# Patient Record
Sex: Female | Born: 2000 | Race: Black or African American | Hispanic: No | Marital: Single | Smoking: Never smoker
Health system: Southern US, Community
[De-identification: ages and names within clinical notes are randomized; demographics above are authoritative.]

## PROBLEM LIST (undated history)

## (undated) ENCOUNTER — Inpatient Hospital Stay (HOSPITAL_COMMUNITY): Payer: Self-pay

## (undated) DIAGNOSIS — O139 Gestational [pregnancy-induced] hypertension without significant proteinuria, unspecified trimester: Secondary | ICD-10-CM

## (undated) HISTORY — DX: Gestational (pregnancy-induced) hypertension without significant proteinuria, unspecified trimester: O13.9

---
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
## 2020-12-02 NOTE — L&D Delivery Note (Signed)
Delivery Note Called to bedside and patient complete and pushing. At 7:07 AM a viable female was delivered via Vaginal, Spontaneous (Presentation: Middle Occiput Anterior).  APGAR: 8, 9; weight pending. No nuchal cord noted at delivery   Placenta status: Spontaneous, Intact.  Cord: 3 vessels with the following complications: none. Placenta to pathology with marginal cord insert.  Anesthesia: Epidural Episiotomy: None Lacerations: 1st degree Suture Repair: 3.0 vicryl Est. Blood Loss (mL): 200  Mom to postpartum.  Baby to Couplet care / Skin to Skin.  Alric Seton 06/15/2021, 7:58 AM

## 2021-01-16 ENCOUNTER — Other Ambulatory Visit: Payer: Self-pay

## 2021-01-16 ENCOUNTER — Ambulatory Visit (INDEPENDENT_AMBULATORY_CARE_PROVIDER_SITE_OTHER): Payer: BC Managed Care – PPO | Admitting: Advanced Practice Midwife

## 2021-01-16 ENCOUNTER — Encounter: Payer: Self-pay | Admitting: Advanced Practice Midwife

## 2021-01-16 ENCOUNTER — Other Ambulatory Visit (HOSPITAL_COMMUNITY)
Admission: RE | Admit: 2021-01-16 | Discharge: 2021-01-16 | Disposition: A | Discharge status: Home / Self Care | Payer: BC Managed Care – PPO | Source: Ambulatory Visit | Attending: Advanced Practice Midwife | Admitting: Advanced Practice Midwife

## 2021-01-16 VITALS — BP 116/65 | HR 82 | Wt 173.0 lb

## 2021-01-16 DIAGNOSIS — O99810 Abnormal glucose complicating pregnancy: Secondary | ICD-10-CM | POA: Insufficient documentation

## 2021-01-16 DIAGNOSIS — Z3A11 11 weeks gestation of pregnancy: Secondary | ICD-10-CM | POA: Insufficient documentation

## 2021-01-16 DIAGNOSIS — Z3A16 16 weeks gestation of pregnancy: Secondary | ICD-10-CM | POA: Diagnosis present

## 2021-01-16 DIAGNOSIS — Z3400 Encounter for supervision of normal first pregnancy, unspecified trimester: Secondary | ICD-10-CM | POA: Diagnosis present

## 2021-01-16 DIAGNOSIS — B354 Tinea corporis: Secondary | ICD-10-CM | POA: Diagnosis not present

## 2021-01-16 MED ORDER — CLOTRIMAZOLE-BETAMETHASONE 1-0.05 % EX CREA: 1.0000 "application " | TOPICAL_CREAM | Freq: Two times a day (BID) | CUTANEOUS | 0 refills | Status: DC

## 2021-01-16 MED ORDER — PRENATAL VITAMINS 28-0.8 MG PO TABS: ORAL_TABLET | ORAL | 11 refills | Status: DC

## 2021-01-16 NOTE — Progress Notes (Signed)
DATING AND VIABILITY SONOGRAM   Laurie Farmer is a 20 y.o. year old G1P0 with LMP Patient's last menstrual period was 10/29/2020. which would correlate to  [redacted]w[redacted]d weeks gestation.  She has irregular menstrual cycles.   She is here today for a confirmatory initial sonogram.    GESTATION: SINGLETON     FETAL ACTIVITY:          Heart rate         159 bpm          The fetus is active.    GESTATIONAL AGE AND  BIOMETRICS:  Gestational criteria: Estimated Date of Delivery: 08/05/21 by LMP now at [redacted]w[redacted]d  Previous Scans:0  HC 12.03 cm 16-0 weeks  BPD 3.27 cm 16-1 weeks  FL 1.55 cm 15-1 weeks                                                                           AVERAGE EGA(BY THIS SCAN): 16-0  weeks  WORKING EDD( midtrimester ultrasound ):       TECHNICIAN COMMENTS:  Patient informed that the ultrasound is considered a limited obstetric ultrasound and is not intended to be a complete ultrasound exam. Patient also informed that the ultrasound is not being completed with the intent of assessing for fetal or placental anomalies or any pelvic abnormalities. Explained that the purpose of today's ultrasound is to assess for fetal heart rate. Patient acknowledges the purpose of the exam and the limitations of the study.     Armandina Stammer 01/16/2021 10:05 AM  Patient was assessed and managed by nursing staff during this encounter. I have reviewed the chart and agree with the documentation and plan. I have also made any necessary editorial changes.  Wynelle Bourgeois, CNM 01/16/2021 12:19 PM

## 2021-01-16 NOTE — Addendum Note (Signed)
Addended by: Mikey Bussing on: 01/16/2021 04:12 PM   Modules accepted: Orders

## 2021-01-16 NOTE — Progress Notes (Signed)
INITIAL OBSTETRICAL VISIT Patient name: Laurie Farmer MRN [id number]  Date of birth: 02-11-2001 Chief Complaint:   Initial Prenatal Visit  History of Present Illness:   Laurie Farmer is a 20 y.o. G1P0 African American female at [redacted]w[redacted]d by [place] at 16 weeks (11 weeks by LMP)  with an Estimated Date of Delivery: 08/05/21 being seen today for her initial obstetrical visit.   Her obstetrical history is significant for primigravida, possible Type 2 DM.   Her mother states Pediatrician told her when she was 13 that her blood sugar was elevated.  Put her on a diet but did not do formal testing.  Never gave her a meter to use  Today she reports no complaints.    Patient's last menstrual period was 10/29/2020. Last pap never.  Review of Systems:   Pertinent items are noted in HPI Denies cramping/contractions, leakage of fluid, vaginal bleeding, abnormal vaginal discharge w/ itching/odor/irritation, headaches, visual changes, shortness of breath, chest pain, abdominal pain, severe nausea/vomiting, or problems with urination or bowel movements unless otherwise stated above.  Pertinent History Reviewed:  Reviewed past medical,surgical, social, obstetrical and family history.  Reviewed problem list, medications and allergies. OB History  Gravida Para Term Preterm AB Living  1            SAB IAB Ectopic Multiple Live Births               # Outcome Date GA Lbr Len/2nd Weight Sex Delivery Anes PTL Lv  1 Current            Physical Assessment:   Vitals:   01/16/21 0924  BP: 116/65  Pulse: 82  Weight: 173 lb (78.5 kg)  There is no height or weight on file to calculate BMI.       Physical Examination:  General appearance - well appearing, and in no distress  Mental status - alert, oriented to person, place, and time  Psych:  She has a normal mood and affect  Skin - warm and dry, normal color, no suspicious lesions noted  Chest - effort normal, all lung fields clear to auscultation  bilaterally  Heart - normal rate and regular rhythm  Abdomen - soft, nontender  Extremities:  No swelling or varicosities noted  Pelvic - VULVA: normal appearing vulva with no masses, tenderness or lesions  VAGINA: normal appearing vagina with normal color and discharge, no lesions  CERVIX: normal appearing cervix without discharge or lesions, no CMT  Thin prep pap is not done  Chaperone: Chiquita      Assessment & Plan:  1) Low-Risk Pregnancy G1P0 at [redacted]w[redacted]d with an Estimated Date of Delivery: 07/03/21      May be high risk if diabetic  2) Initial OB visit  3) Possible Type 2 DM.  Limited info from Pediatrician.  Never got formal testing  Meds:  Meds ordered this encounter  Medications  . clotrimazole-betamethasone (LOTRISONE) cream    Sig: Apply 1 application topically 2 (two) times daily.    Dispense:  30 g    Refill:  0    Order Specific Question:   Supervising Provider    Answer:   Jaynie Collins A [[id number]]    Initial labs obtained Continue prenatal vitamins Reviewed n/v relief measures and warning s/s to report Reviewed recommended weight gain based on pre-gravid BMI Encouraged well-balanced diet Genetic & carrier screening discussed: requests Panorama, requests Panorama Ultrasound discussed; fetal survey: requested CCNC completed> form faxed if has or is planning to apply  for medicaid The nature of  - [facility] [employer] with multiple MDs and other Advanced Practice Providers was explained to patient; also emphasized that fellows, residents, and students are part of our team.   Indications for ASA therapy (per uptodate) One of the following: H/O preeclampsia, especially early onset/adverse outcome No Multifetal gestation No CHTN No T1DM or T2DM No Chronic kidney disease No Autoimmune disease (antiphospholipid syndrome, systemic lupus erythematosus) No  OR Two or more of the following: Nulliparity Yes Obesity (BMI>30 kg/m2) No Family h/o  preeclampsia in mother or sister No Age ?35 years No Sociodemographic characteristics (African American race, low socioeconomic level) Yes Personal risk factors (eg, previous pregnancy w/ LBW or SGA, previous adverse pregnancy outcome [eg, stillbirth], interval >10 years between pregnancies) No  Indications for early A1C (per uptodate) BMI >=25 (>=23 in Asian women) AND one of the following GDM in a previous pregnancy No Previous A1C?5.7, impaired glucose tolerance, or impaired fasting glucose on previous testing No First-degree relative with diabetes No High-risk race/ethnicity (eg, African American, Latino, Native American, Asian American, Pacific Islander) Yes History of cardiovascular disease No HTN or on therapy for hypertension No HDL cholesterol level <35 mg/dL (0.10 mmol/L) and/or a triglyceride level >250 mg/dL (9.32 mmol/L) No PCOS No Physical inactivity No Other clinical condition associated with insulin resistance (eg, severe obesity, acanthosis nigricans) Yes  Told by pediatrician sugar was high, never tested Previous birth of an infant weighing ?4000 g No Previous stillbirth of unknown cause No >= 40yo No  Follow-up: Return in about 4 weeks (around 02/13/2021) for [employer].   Orders Placed This Encounter  Procedures  . Culture, OB Urine  . [place] MFM OB COMP + 14 WK  . Hemoglobpathy+Fer w/A Thal Rfx  . CBC/D/Plt+RPR+Rh+ABO+Rub Ab...  . AFP, Serum, Open Spina Bifida  . Hemoglobin A1c  . Genetic Screening  . POC Urinalysis Dipstick OB   Start baby ASA daily   Wynelle Bourgeois CNM, [facility] 01/16/2021 12:19 PM

## 2021-01-16 NOTE — Patient Instructions (Signed)
Oral Glucose Tolerance Test During Pregnancy Why am I having this test? The oral glucose tolerance test (OGTT) is done to check how your body processes blood sugar (glucose). This is one of several tests used to diagnose diabetes that develops during pregnancy (gestational diabetes mellitus). Gestational diabetes is a short-term form of diabetes that some women develop while they are pregnant. It usually occurs during the second trimester of pregnancy and goes away after delivery. Testing, or screening, for gestational diabetes usually occurs at weeks 24-28 of pregnancy. You may have the OGTT test after having a 1-hour glucose screening test if the results from that test indicate that you may have gestational diabetes. This test may also be needed if:  You have a history of gestational diabetes.  There is a history of giving birth to very large babies or of losing pregnancies (having stillbirths).  You have signs and symptoms of diabetes, such as: ? Changes in your eyesight. ? Tingling or numbness in your hands or feet. ? Changes in hunger, thirst, and urination, and these are not explained by your pregnancy. What is being tested? This test measures the amount of glucose in your blood at different times during a period of 3 hours. This shows how well your body can process glucose. What kind of sample is taken? Blood samples are required for this test. They are usually collected by inserting a needle into a blood vessel.   How do I prepare for this test?  For 3 days before your test, eat normally. Have plenty of carbohydrate-rich foods.  Follow instructions from your health care provider about: ? Eating or drinking restrictions on the day of the test. You may be asked not to eat or drink anything other than water (to fast) starting 8-10 hours before the test. ? Changing or stopping your regular medicines. Some medicines may interfere with this test. Tell a health care provider about:  All  medicines you are taking, including vitamins, herbs, eye drops, creams, and over-the-counter medicines.  Any blood disorders you have.  Any surgeries you have had.  Any medical conditions you have. What happens during the test? First, your blood glucose will be measured. This is referred to as your fasting blood glucose because you fasted before the test. Then, you will drink a glucose solution that contains a certain amount of glucose. Your blood glucose will be measured again 1, 2, and 3 hours after you drink the solution. This test takes about 3 hours to complete. You will need to stay at the testing location during this time. During the testing period:  Do not eat or drink anything other than the glucose solution.  Do not exercise.  Do not use any products that contain nicotine or tobacco, such as cigarettes, e-cigarettes, and chewing tobacco. These can affect your test results. If you need help quitting, ask your health care provider. The testing procedure may vary among health care providers and hospitals. How are the results reported? Your results will be reported as milligrams of glucose per deciliter of blood (mg/dL) or millimoles per liter (mmol/L). There is more than one source for screening and diagnosis reference values used to diagnose gestational diabetes. Your health care provider will compare your results to normal values that were established after testing a large group of people (reference values). Reference values may vary among labs and hospitals. For this test (Carpenter-Coustan), reference values are:  Fasting: 95 mg/dL (5.3 mmol/L).  1 hour: 180 mg/dL (10.0 mmol/L).  2 hour:   155 mg/dL (8.6 mmol/L).  3 hour: 140 mg/dL (7.8 mmol/L). What do the results mean? Results below the reference values are considered normal. If two or more of your blood glucose levels are at or above the reference values, you may be diagnosed with gestational diabetes. If only one level is  high, your health care provider may suggest repeat testing or other tests to confirm a diagnosis. Talk with your health care provider about what your results mean. Questions to ask your health care provider Ask your health care provider, or the department that is doing the test:  When will my results be ready?  How will I get my results?  What are my treatment options?  What other tests do I need?  What are my next steps? Summary  The oral glucose tolerance test (OGTT) is one of several tests used to diagnose diabetes that develops during pregnancy (gestational diabetes mellitus). Gestational diabetes is a short-term form of diabetes that some women develop while they are pregnant.  You may have the OGTT test after having a 1-hour glucose screening test if the results from that test show that you may have gestational diabetes. You may also have this test if you have any symptoms or risk factors for this type of diabetes.  Talk with your health care provider about what your results mean. This information is not intended to replace advice given to you by your health care provider. Make sure you discuss any questions you have with your health care provider. Document Revised: 04/27/2020 Document Reviewed: 04/27/2020 Elsevier Patient Education  2021 Elsevier Inc. Second Trimester of Pregnancy  The second trimester of pregnancy is from week 13 through week 27. This is also called months 4 through 6 of pregnancy. This is often the time when you feel your best. During the second trimester:  Morning sickness is less or has stopped.  You may have more energy.  You may feel hungry more often. At this time, your unborn baby (fetus) is growing very fast. At the end of the sixth month, the unborn baby may be up to 12 inches long and weigh about 1 pounds. You will likely start to feel the baby move between 16 and 20 weeks of pregnancy. Body changes during your second trimester Your body  continues to go through many changes during this time. The changes vary and generally return to normal after the baby is born. Physical changes  You will gain more weight.  You may start to get stretch marks on your hips, belly (abdomen), and breasts.  Your breasts will grow and may hurt.  Dark spots or blotches may develop on your face.  A dark line from your belly button to the pubic area (linea nigra) may appear.  You may have changes in your hair. Health changes  You may have headaches.  You may have heartburn.  You may have trouble pooping (constipation).  You may have hemorrhoids or swollen, bulging veins (varicose veins).  Your gums may bleed.  You may pee (urinate) more often.  You may have back pain. Follow these instructions at home: Medicines  Take over-the-counter and prescription medicines only as told by your doctor. Some medicines are not safe during pregnancy.  Take a prenatal vitamin that contains at least 600 micrograms (mcg) of folic acid. Eating and drinking  Eat healthy meals that include: ? Fresh fruits and vegetables. ? Whole grains. ? Good sources of protein, such as meat, eggs, or tofu. ? Low-fat dairy products.  Avoid raw meat and unpasteurized juice, milk, and cheese.  You may need to take these actions to prevent or treat trouble pooping: ? Drink enough fluids to keep your pee (urine) pale yellow. ? Eat foods that are high in fiber. These include beans, whole grains, and fresh fruits and vegetables. ? Limit foods that are high in fat and sugar. These include fried or sweet foods. Activity  Exercise only as told by your doctor. Most people can do their usual exercise during pregnancy. Try to exercise for 30 minutes at least 5 days a week.  Stop exercising if you have pain or cramps in your belly or lower back.  Do not exercise if it is too hot or too humid, or if you are in a place of great height (high altitude).  Avoid heavy  lifting.  If you choose to, you may have sex unless your doctor tells you not to. Relieving pain and discomfort  Wear a good support bra if your breasts are sore.  Take warm water baths (sitz baths) to soothe pain or discomfort caused by hemorrhoids. Use hemorrhoid cream if your doctor approves.  Rest with your legs raised (elevated) if you have leg cramps or low back pain.  If you develop bulging veins in your legs: ? Wear support hose as told by your doctor. ? Raise your feet for 15 minutes, 3-4 times a day. ? Limit salt in your food. Safety  Wear your seat belt at all times when you are in a car.  Talk with your doctor if someone is hurting you or yelling at you a lot. Lifestyle  Do not use hot tubs, steam rooms, or saunas.  Do not douche. Do not use tampons or scented sanitary pads.  Avoid cat litter boxes and soil used by cats. These carry germs that can harm your baby and can cause a loss of your baby by miscarriage or stillbirth.  Do not use herbal medicines, illegal drugs, or medicines that are not approved by your doctor. Do not drink alcohol.  Do not smoke or use any products that contain nicotine or tobacco. If you need help quitting, ask your doctor. General instructions  Keep all follow-up visits. This is important.  Ask your doctor about local prenatal classes.  Ask your doctor about the right foods to eat or for help finding a counselor. Where to find more information  American Pregnancy Association: americanpregnancy.org  [employer]: www.acog.org  Office on [employer] [facility]: [employer].nz Contact a doctor if:  You have a headache that does not go away when you take medicine.  You have changes in how you see, or you see spots in front of your eyes.  You have mild cramps, pressure, or pain in your lower belly.  You continue to feel like you may vomit (nauseous), you vomit, or you have watery poop  (diarrhea).  You have bad-smelling fluid coming from your vagina.  You have pain when you pee or your pee smells bad.  You have very bad swelling of your face, hands, ankles, feet, or legs.  You have a fever. Get help right away if:  You are leaking fluid from your vagina.  You have spotting or bleeding from your vagina.  You have very bad belly cramping or pain.  You have trouble breathing.  You have chest pain.  You faint.  You have not felt your baby move for the time period told by your doctor.  You have new  You have new or increased pain, swelling, or redness in an arm or leg. Summary  The second trimester of pregnancy is from week 13 through week 27 (months 4 through 6).  Eat healthy meals.  Exercise as told by your doctor. Most people can do their usual exercise during pregnancy.  Do not use herbal medicines, illegal drugs, or medicines that are not approved by your doctor. Do not drink alcohol.  Call your doctor if you get sick or if you notice anything unusual about your pregnancy. This information is not intended to replace advice given to you by your health care provider. Make sure you discuss any questions you have with your health care provider. Document Revised: 04/26/2020 Document Reviewed: 03/02/2020 Elsevier Patient Education  2021 Elsevier Inc.  

## 2021-01-17 ENCOUNTER — Encounter: Payer: Self-pay | Admitting: Advanced Practice Midwife

## 2021-01-17 DIAGNOSIS — O98212 Gonorrhea complicating pregnancy, second trimester: Secondary | ICD-10-CM | POA: Insufficient documentation

## 2021-01-17 LAB — GC/CHLAMYDIA PROBE AMP (~~LOC~~) NOT AT ARMC
Chlamydia: NEGATIVE
Comment: NEGATIVE
Comment: NORMAL
Neisseria Gonorrhea: POSITIVE — AB

## 2021-01-18 LAB — AFP, SERUM, OPEN SPINA BIFIDA
AFP MoM: 1.09
AFP Value: 36.8 ng/mL
Gest. Age on Collection Date: 16 weeks
Maternal Age At EDD: 20.5 yr
OSBR Risk 1 IN: 10000
Test Results:: NEGATIVE
Weight: 173 [lb_av]

## 2021-01-18 LAB — HEMOGLOBIN A1C
Est. average glucose Bld gHb Est-mCnc: 108 mg/dL
Hgb A1c MFr Bld: 5.4 % (ref 4.8–5.6)

## 2021-01-19 LAB — URINE CULTURE, OB REFLEX

## 2021-01-19 LAB — CULTURE, OB URINE

## 2021-01-22 ENCOUNTER — Telehealth: Payer: Self-pay

## 2021-01-22 NOTE — Telephone Encounter (Signed)
Called pt to explain to her why she has been prescribed baby aspirin. Per Laurie Farmer, pt was prescribed aspirin because of her being a first time mom and her ethnicity puts her at a higher risk for developing high blood pressure later in pregnancy.Understanding was voiced. Dalaney Needle l Dimitria Ketchum, CMA

## 2021-01-22 NOTE — Telephone Encounter (Signed)
-----   Message from Aviva Signs, CNM sent at 01/16/2021  9:07 PM EST ----- Regarding: RE: Aspirin Her being a first time mom and ethnicity puts her at higher risk of developing high blood pressure later in pregnancy The aspirin can help prevent that  I told her that several times on the phone  Hilda Lias   ----- Message ----- From: Mikey Bussing, CMA Sent: 01/16/2021   3:32 PM EST To: Aviva Signs, CNM Subject: Aspirin                                        Hi Hilda Lias, this pt wants to know why she has to take aspirin?

## 2021-01-23 ENCOUNTER — Other Ambulatory Visit: Payer: Self-pay | Admitting: Advanced Practice Midwife

## 2021-01-23 ENCOUNTER — Telehealth: Payer: Self-pay

## 2021-01-23 MED ORDER — CEFADROXIL 500 MG PO CAPS: 500.0000 mg | ORAL_CAPSULE | Freq: Two times a day (BID) | ORAL | 0 refills | Status: AC

## 2021-01-23 NOTE — Telephone Encounter (Signed)
Patient called and made aware of Gonorrhea positive result. Patient made aware that she will need to come in for injection of rocephin. Patient also made aware to refrain from any intercourse until her partner is tested. Patient states understanding and schedule appointment for nurse visit tomorrow at 2pm. Armandina Stammer RN

## 2021-01-23 NOTE — Progress Notes (Signed)
Urine culture klebsiella  Rx sent for Duricef  Pt notified

## 2021-01-24 ENCOUNTER — Ambulatory Visit (INDEPENDENT_AMBULATORY_CARE_PROVIDER_SITE_OTHER): Payer: BC Managed Care – PPO

## 2021-01-24 ENCOUNTER — Other Ambulatory Visit: Payer: Self-pay

## 2021-01-24 VITALS — BP 118/68 | HR 84

## 2021-01-24 DIAGNOSIS — Z3A17 17 weeks gestation of pregnancy: Secondary | ICD-10-CM | POA: Diagnosis not present

## 2021-01-24 DIAGNOSIS — O98212 Gonorrhea complicating pregnancy, second trimester: Secondary | ICD-10-CM

## 2021-01-24 MED ORDER — CEFTRIAXONE SODIUM 500 MG IJ SOLR
500.0000 mg | Freq: Once | INTRAMUSCULAR | Status: AC
  Administered 2021-01-24: 500 mg via INTRAMUSCULAR

## 2021-01-24 NOTE — Progress Notes (Addendum)
Patient presents for rocephin injection due to positive Gonorrhea. Patient made aware to abstain from any intercourse until her partner had been tested and/or treated. Patient states understanding. Also made aware that we will retest her in a month for a test of cure. Armandina Stammer RN    Patient was assessed and managed by nursing staff during this encounter. I have reviewed the chart and agree with the documentation and plan. I have also made any necessary editorial changes.  Wynelle Bourgeois, CNM 01/24/2021 9:56 PM

## 2021-01-26 LAB — ALPHA-THALASSEMIA

## 2021-01-26 LAB — CBC/D/PLT+RPR+RH+ABO+RUB AB...
Antibody Screen: NEGATIVE
Basophils Absolute: 0 10*3/uL (ref 0.0–0.2)
Basos: 1 %
EOS (ABSOLUTE): 0.1 10*3/uL (ref 0.0–0.4)
Eos: 2 %
HCV Ab: <0.1 s/co ratio (ref 0.0–0.9)
HIV Screen 4th Generation wRfx: NONREACTIVE
Hematocrit: 36 % (ref 34.0–46.6)
Hemoglobin: 11.7 g/dL (ref 11.1–15.9)
Hepatitis B Surface Ag: NEGATIVE
Immature Grans (Abs): 0 10*3/uL (ref 0.0–0.1)
Immature Granulocytes: 0 %
Lymphocytes Absolute: 2.1 10*3/uL (ref 0.7–3.1)
Lymphs: 32 %
MCH: 25.2 pg — ABNORMAL LOW (ref 26.6–33.0)
MCHC: 32.5 g/dL (ref 31.5–35.7)
MCV: 78 fL — ABNORMAL LOW (ref 79–97)
Monocytes Absolute: 0.6 10*3/uL (ref 0.1–0.9)
Monocytes: 9 %
Neutrophils Absolute: 3.7 10*3/uL (ref 1.4–7.0)
Neutrophils: 56 %
Platelets: 262 10*3/uL (ref 150–450)
RBC: 4.64 x10E6/uL (ref 3.77–5.28)
RDW: 14.8 % (ref 11.7–15.4)
RPR Ser Ql: REACTIVE — AB
Rh Factor: POSITIVE
Rubella Antibodies, IGG: 2.38 index (ref >0.99)
WBC: 6.5 10*3/uL (ref 3.4–10.8)

## 2021-01-26 LAB — HEMOGLOBPATHY+FER W/A THAL RFX
Ferritin: 38 ng/mL (ref 15–150)
Hgb A2: 2.5 % (ref 1.8–3.2)
Hgb A: 97.5 % (ref 96.4–98.8)
Hgb F: 0 % (ref 0.0–2.0)
Hgb S: 0 %

## 2021-01-26 LAB — RPR, QUANT+TP ABS (REFLEX)
Rapid Plasma Reagin, Quant: 1:1 {titer} — ABNORMAL HIGH
T Pallidum Abs: NONREACTIVE

## 2021-01-26 LAB — HCV INTERPRETATION

## 2021-01-29 ENCOUNTER — Telehealth: Payer: Self-pay

## 2021-01-29 NOTE — Telephone Encounter (Signed)
[facility] called and wanted to verify that we received the alpha thalassemia results on patient (abnormal). Verified we had received and will route this to the attention of a provider. Armandina Stammer RN

## 2021-01-30 ENCOUNTER — Other Ambulatory Visit: Payer: Self-pay

## 2021-01-30 NOTE — Progress Notes (Signed)
error 

## 2021-02-06 ENCOUNTER — Other Ambulatory Visit: Payer: Self-pay

## 2021-02-06 ENCOUNTER — Telehealth: Payer: Self-pay

## 2021-02-06 ENCOUNTER — Ambulatory Visit: Payer: BC Managed Care – PPO | Attending: Advanced Practice Midwife

## 2021-02-06 DIAGNOSIS — Z3400 Encounter for supervision of normal first pregnancy, unspecified trimester: Secondary | ICD-10-CM | POA: Diagnosis present

## 2021-02-06 DIAGNOSIS — Z3A16 16 weeks gestation of pregnancy: Secondary | ICD-10-CM | POA: Diagnosis present

## 2021-02-06 MED ORDER — CEFADROXIL 500 MG PO CAPS: 500.0000 mg | ORAL_CAPSULE | Freq: Two times a day (BID) | ORAL | 0 refills | Status: DC

## 2021-02-06 NOTE — Telephone Encounter (Signed)
Patient called stating that she dropped the remaining antibiotics in the toilet. Patient states she had six doses left. Patient states that she picked it up later because of insurance issues. Made her aware that we will try and send in the remaining she had but insurance may give her problem about filling it again. Armandina Stammer RN

## 2021-02-07 ENCOUNTER — Other Ambulatory Visit: Payer: Self-pay | Admitting: *Deleted

## 2021-02-07 DIAGNOSIS — Z362 Encounter for other antenatal screening follow-up: Secondary | ICD-10-CM

## 2021-02-13 ENCOUNTER — Ambulatory Visit (INDEPENDENT_AMBULATORY_CARE_PROVIDER_SITE_OTHER): Payer: BC Managed Care – PPO | Admitting: Advanced Practice Midwife

## 2021-02-13 ENCOUNTER — Other Ambulatory Visit (HOSPITAL_COMMUNITY)
Admission: RE | Admit: 2021-02-13 | Discharge: 2021-02-13 | Disposition: A | Discharge status: Home / Self Care | Payer: BC Managed Care – PPO | Source: Ambulatory Visit | Attending: Advanced Practice Midwife | Admitting: Advanced Practice Midwife

## 2021-02-13 ENCOUNTER — Encounter: Payer: Self-pay | Admitting: Advanced Practice Midwife

## 2021-02-13 ENCOUNTER — Other Ambulatory Visit: Payer: Self-pay

## 2021-02-13 VITALS — BP 106/61 | HR 90 | Wt 176.0 lb

## 2021-02-13 DIAGNOSIS — D563 Thalassemia minor: Secondary | ICD-10-CM

## 2021-02-13 DIAGNOSIS — N3000 Acute cystitis without hematuria: Secondary | ICD-10-CM

## 2021-02-13 DIAGNOSIS — O98212 Gonorrhea complicating pregnancy, second trimester: Secondary | ICD-10-CM | POA: Diagnosis not present

## 2021-02-13 DIAGNOSIS — Z3A20 20 weeks gestation of pregnancy: Secondary | ICD-10-CM

## 2021-02-13 NOTE — Progress Notes (Signed)
   PRENATAL VISIT NOTE  Subjective:  Laurie Farmer is a 20 y.o. G1P0 at [redacted]w[redacted]d being seen today for ongoing prenatal care.  She is currently monitored for the following issues for this low-risk pregnancy and has Supervision of normal first pregnancy, antepartum; Tinea corporis; Impaired glucose in pregnancy, antepartum; and Gonorrhea affecting pregnancy in second trimester on their problem list.  Patient reports no complaints.  Contractions: Not present. Vag. Bleeding: None.  Movement: Absent. Denies leaking of fluid.   The following portions of the patient's history were reviewed and updated as appropriate: allergies, current medications, past family history, past medical history, past social history, past surgical history and problem list.   Objective:   Vitals:   02/13/21 0856  BP: 106/61  Pulse: 90  Weight: 176 lb (79.8 kg)    Fetal Status: Fetal Heart Rate (bpm): 145   Movement: Absent     General:  Alert, oriented and cooperative. Patient is in no acute distress.  Skin: Skin is warm and dry. No rash noted.   Cardiovascular: Normal heart rate noted  Respiratory: Normal respiratory effort, no problems with respiration noted  Abdomen: Soft, gravid, appropriate for gestational age.  Pain/Pressure: Absent     Pelvic: Cervical exam deferred        Extremities: Normal range of motion.  Edema: None  Mental Status: Normal mood and affect. Normal behavior. Normal judgment and thought content.   Assessment and Plan:  Pregnancy: G1P0 at [redacted]w[redacted]d 1. Gonorrhea affecting pregnancy in second trimester      Test of cure via urine today - GC/Chlamydia probe amp ([place])not at [facility]  2. [redacted] weeks gestation of pregnancy       3. Acute cystitis without hematuria     Started Rx yesterday  4. Alpha thalassemia silent carrier     Discussed result     Address given for FOB to be tested at [facility]  Preterm labor symptoms and general obstetric precautions including but not  limited to vaginal bleeding, contractions, leaking of fluid and fetal movement were reviewed in detail with the patient. Please refer to After Visit Summary for other counseling recommendations.   Return in about 4 weeks (around 03/13/2021) for [facility].  Future Appointments  Date Time Provider Department Center  03/13/2021  9:55 AM Aviva Signs, CNM [facility] None  03/20/2021 11:00 AM [facility] NURSE [facility]  03/20/2021 11:15 AM [facility] US2 [facility]    Wynelle Bourgeois, CNM

## 2021-02-13 NOTE — Patient Instructions (Addendum)

## 2021-02-14 LAB — GC/CHLAMYDIA PROBE AMP (~~LOC~~) NOT AT ARMC
Chlamydia: NEGATIVE
Comment: NEGATIVE
Comment: NORMAL
Neisseria Gonorrhea: NEGATIVE

## 2021-02-16 ENCOUNTER — Encounter (HOSPITAL_COMMUNITY): Payer: Self-pay | Admitting: Emergency Medicine

## 2021-02-16 ENCOUNTER — Ambulatory Visit (HOSPITAL_COMMUNITY)
Admission: EM | Admit: 2021-02-16 | Discharge: 2021-02-16 | Disposition: A | Discharge status: Home / Self Care | Payer: BC Managed Care – PPO | Attending: Medical Oncology | Admitting: Medical Oncology

## 2021-02-16 ENCOUNTER — Telehealth: Payer: Self-pay

## 2021-02-16 ENCOUNTER — Other Ambulatory Visit: Payer: Self-pay

## 2021-02-16 DIAGNOSIS — Z113 Encounter for screening for infections with a predominantly sexual mode of transmission: Secondary | ICD-10-CM | POA: Diagnosis not present

## 2021-02-16 DIAGNOSIS — Z7982 Long term (current) use of aspirin: Secondary | ICD-10-CM | POA: Insufficient documentation

## 2021-02-16 DIAGNOSIS — N898 Other specified noninflammatory disorders of vagina: Secondary | ICD-10-CM | POA: Diagnosis present

## 2021-02-16 DIAGNOSIS — B373 Candidiasis of vulva and vagina: Secondary | ICD-10-CM | POA: Insufficient documentation

## 2021-02-16 DIAGNOSIS — Z3A20 20 weeks gestation of pregnancy: Secondary | ICD-10-CM | POA: Diagnosis present

## 2021-02-16 DIAGNOSIS — O23592 Infection of other part of genital tract in pregnancy, second trimester: Secondary | ICD-10-CM | POA: Diagnosis not present

## 2021-02-16 MED ORDER — MICONAZOLE NITRATE 2 % VA CREA: 1.0000 | TOPICAL_CREAM | Freq: Every day | VAGINAL | 0 refills | Status: DC

## 2021-02-16 NOTE — ED Triage Notes (Signed)
Patient c/o abnormal discharge x 2 days.   Patient endorses she's [redacted] weeks pregnant.   Patient endorses " watery discharge with white, clumpy to thin discharge".   Patient endorses itchiness.   Patient denies foul odor.   Patient denies any ABD pain or vaginal bleeding.   Patient didn't use any medications for symptoms.

## 2021-02-16 NOTE — Telephone Encounter (Signed)
Pt called stating she has been having heavy discharge x 3 days. Pt states the discharge is causing her to itch. Due to not being able to bring pt in to see the doctor, pt was advised to go to [facility] to be seen. Understanding was voiced. Chavonne Sforza l Yoshiaki Kreuser, CMA

## 2021-02-16 NOTE — ED Provider Notes (Signed)
[facility]    CSN: [id number] Arrival date & time: 02/16/21  1107      History   Chief Complaint Chief Complaint  Patient presents with  . Vaginal Discharge    HPI Laurie Farmer is a 20 y.o. female.   HPI   Vaginal Discharge: Patient reports that for the past 2 days she has had a watery white vaginal discharge with clumpy discharge as well.  She is having some vaginal itching.  She denies any foul odor, abdominal pain, dysuria, vaginal bleeding. She has not tried any home treatments as she is currently [redacted] weeks pregnant and she was unsure what was safe to use.   History reviewed. No pertinent past medical history.  Patient Active Problem List   Diagnosis Date Noted  . Gonorrhea affecting pregnancy in second trimester 01/17/2021  . Supervision of normal first pregnancy, antepartum 01/16/2021  . Tinea corporis 01/16/2021  . Impaired glucose in pregnancy, antepartum 01/16/2021    History reviewed. No pertinent surgical history.  OB History    Gravida  1   Para      Term      Preterm      AB      Living        SAB      IAB      Ectopic      Multiple      Live Births               Home Medications    Prior to Admission medications   Medication Sig Start Date End Date Taking? Authorizing Provider  miconazole (MICONAZOLE 7) 2 % vaginal cream Place 1 Applicatorful vaginally at bedtime. 02/16/21  Yes Rushie Chestnut, PA-C  ASPIRIN 81 PO Take by mouth.    [provider]  Prenatal Vit-Fe Fumarate-FA (PRENATAL VITAMINS) 28-0.8 MG TABS Take 1 tablet by mouth daily. 01/16/21   Aviva Signs, CNM    Family History History reviewed. No pertinent family history.  Social History Social History   Tobacco Use  . Smoking status: Never Smoker  . Smokeless tobacco: Never Used  Substance Use Topics  . Alcohol use: Never  . Drug use: Never     Allergies   Patient has no known allergies.   Review of Systems Review of  Systems  As stated above in HPI Physical Exam Triage Vital Signs ED Triage Vitals  Enc Vitals Group     BP 02/16/21 1130 121/77     Pulse Rate 02/16/21 1130 100     Resp 02/16/21 1130 13     Temp 02/16/21 1130 98.3 F (36.8 C)     Temp Source 02/16/21 1130 Oral     SpO2 02/16/21 1130 97 %     Weight --      Height --      Head Circumference --      Peak Flow --      Pain Score 02/16/21 1128 0     Pain Loc --      Pain Edu? --      Excl. in GC? --    No data found.  Updated Vital Signs BP 121/77 (BP Location: Left Arm)   Pulse 100   Temp 98.3 F (36.8 C) (Oral)   Resp 13   LMP 10/29/2020   SpO2 97%   Physical Exam Vitals and nursing note reviewed.  Constitutional:      General: She is not in acute distress.  Appearance: Normal appearance. She is not ill-appearing, toxic-appearing or diaphoretic.  Cardiovascular:     Rate and Rhythm: Normal rate and regular rhythm.     Heart sounds: Normal heart sounds.  Pulmonary:     Breath sounds: Normal breath sounds.  Abdominal:     General: Abdomen is flat.     Palpations: Abdomen is soft.     Tenderness: There is no abdominal tenderness.  Neurological:     Mental Status: She is alert.      UC Treatments / Results  Labs (all labs ordered are listed, but only abnormal results are displayed) Labs Reviewed  CERVICOVAGINAL ANCILLARY ONLY    EKG   Radiology No results found.  Procedures Procedures (including critical care time)  Medications Ordered in UC Medications - No data to display  Initial Impression / Assessment and Plan / UC Course  I have reviewed the triage vital signs and the nursing notes.  Pertinent labs & imaging results that were available during my care of the patient were reviewed by me and considered in my medical decision making (see chart for details).     New.  Likely vaginal candidiasis however we will screen for additional causes for vaginal discharge as conditions such as  gonorrhea, chlamydia and trichomonas can be detrimental to pregnancy.  Given her current pregnancy status we will treat with topical antifungal medication as Diflucan is not considered safe in pregnancy.  We discussed red flag signs and symptoms. Final Clinical Impressions(s) / UC Diagnoses   Final diagnoses:  Vaginal discharge  [redacted] weeks gestation of pregnancy   Discharge Instructions   None    ED Prescriptions    Medication Sig Dispense Auth. Provider   miconazole (MICONAZOLE 7) 2 % vaginal cream Place 1 Applicatorful vaginally at bedtime. 45 g Barnaby Rippeon M, [place]     PDMP not reviewed this encounter.   Rushie Chestnut, [place] 02/16/21 1219

## 2021-02-19 LAB — CERVICOVAGINAL ANCILLARY ONLY
Bacterial Vaginitis (gardnerella): POSITIVE — AB
Candida Glabrata: NEGATIVE
Candida Vaginitis: POSITIVE — AB
Chlamydia: NEGATIVE
Comment: NEGATIVE
Comment: NEGATIVE
Comment: NEGATIVE
Comment: NEGATIVE
Comment: NEGATIVE
Comment: NORMAL
Neisseria Gonorrhea: NEGATIVE
Trichomonas: POSITIVE — AB

## 2021-02-20 ENCOUNTER — Telehealth (HOSPITAL_COMMUNITY): Payer: Self-pay | Admitting: Emergency Medicine

## 2021-02-27 MED ORDER — METRONIDAZOLE 0.75 % VA GEL: 1.0000 | Freq: Every day | VAGINAL | 0 refills | Status: AC

## 2021-02-27 MED ORDER — METRONIDAZOLE 500 MG PO TABS: 2000.0000 mg | ORAL_TABLET | Freq: Once | ORAL | 0 refills | Status: AC

## 2021-03-13 ENCOUNTER — Encounter: Payer: Self-pay | Admitting: Advanced Practice Midwife

## 2021-03-13 ENCOUNTER — Other Ambulatory Visit: Payer: Self-pay

## 2021-03-13 ENCOUNTER — Ambulatory Visit (INDEPENDENT_AMBULATORY_CARE_PROVIDER_SITE_OTHER): Payer: BC Managed Care – PPO | Admitting: Advanced Practice Midwife

## 2021-03-13 VITALS — BP 129/65 | HR 97 | Wt 192.0 lb

## 2021-03-13 DIAGNOSIS — O285 Abnormal chromosomal and genetic finding on antenatal screening of mother: Secondary | ICD-10-CM

## 2021-03-13 DIAGNOSIS — D563 Thalassemia minor: Secondary | ICD-10-CM

## 2021-03-13 DIAGNOSIS — Z3A24 24 weeks gestation of pregnancy: Secondary | ICD-10-CM

## 2021-03-13 NOTE — Progress Notes (Signed)
   PRENATAL VISIT NOTE  Subjective:  Laurie Farmer is a 20 y.o. G1P0 at [redacted]w[redacted]d being seen today for ongoing prenatal care.  She is currently monitored for the following issues for this low-risk pregnancy and has Supervision of normal first pregnancy, antepartum; Tinea corporis; Impaired glucose in pregnancy, antepartum; and Gonorrhea affecting pregnancy in second trimester on their problem list.  Patient reports no complaints.  Contractions: Not present. Vag. Bleeding: None.  Movement: Present. Denies leaking of fluid.   The following portions of the patient's history were reviewed and updated as appropriate: allergies, current medications, past family history, past medical history, past social history, past surgical history and problem list.   Objective:   Vitals:   03/13/21 0959  BP: 129/65  Pulse: 97  Weight: 192 lb (87.1 kg)    Fetal Status: Fetal Heart Rate (bpm): 146   Movement: Present     General:  Alert, oriented and cooperative. Patient is in no acute distress.  Skin: Skin is warm and dry. No rash noted.   Cardiovascular: Normal heart rate noted  Respiratory: Normal respiratory effort, no problems with respiration noted  Abdomen: Soft, gravid, appropriate for gestational age.  Pain/Pressure: Absent     Pelvic: Cervical exam deferred        Extremities: Normal range of motion.  Edema: None  Mental Status: Normal mood and affect. Normal behavior. Normal judgment and thought content.   Assessment and Plan:  Pregnancy: G1P0 at [redacted]w[redacted]d 1. [redacted] weeks gestation of pregnancy  2. Abnormal chromosomal and genetic finding on antenatal screening mother     Referred for genetic counseling - [facility] GENETICS REFERRAL  3. Alpha thalassemia silent carrier     Recommend having FOB tested.  Pts mother states several family members have it.  Preterm labor symptoms and general obstetric precautions including but not limited to vaginal bleeding, contractions, leaking of fluid and fetal  movement were reviewed in detail with the patient. Please refer to After Visit Summary for other counseling recommendations.   Return in about 4 weeks (around 04/10/2021).  Future Appointments  Date Time Provider Department Center  03/20/2021 11:00 AM [facility] NURSE [facility]  03/20/2021 11:15 AM [facility] US2 [facility]  03/20/2021  2:30 PM [facility] GENETIC COUNSELING RM [facility]  03/28/2021  8:30 AM [facility] NURSE [facility] None  04/10/2021 10:55 AM Mayford Knife Elby Showers, CNM [facility] None    Wynelle Bourgeois, CNM

## 2021-03-13 NOTE — Patient Instructions (Signed)
 Glucose Tolerance Test Why am I having this test? The glucose tolerance test (GTT) is done to check how your body processes sugar (glucose). This is one of several tests used to diagnose diabetes (diabetes mellitus). Your health care provider may recommend this test if you:  Have a family history of diabetes.  Are obese.  Have infections that keep coming back.  Have had a lot of wounds that did not heal quickly, especially on your legs and feet.  Are a woman and have a history of giving birth to very large babies or a history of repeated fetal loss (stillbirth).  Have had high glucose levels in your urine or blood: ? During a past pregnancy. ? After a heart attack, surgery, or prolonged periods of high stress. What is being tested? This test measures the amount of glucose in your blood at different times during a period of 2 hours. This indicates how well your body is able to process glucose. What kind of sample is taken? Blood samples are required for this test. They are usually collected by inserting a needle into a blood vessel.   How do I prepare for this test?  For 3 days before your test, eat normally. Have plenty of carbohydrate-rich foods.  Follow instructions from your health care provider about: ? Eating or drinking restrictions on the day of the test. You may be asked to not eat or drink anything other than water (fast) starting 8-12 hours before the test. ? Changing or stopping your regular medicines. Some medicines may interfere with this test. Tell a health care provider about:  All medicines you are taking, including vitamins, herbs, eye drops, creams, and over-the-counter medicines.  Any blood disorders you have.  Any surgeries you have had.  Any medical conditions you have.  Whether you are pregnant or may be pregnant. What happens during the test? First, your blood glucose will be measured. This is referred to as your fasting blood glucose, since you  fasted before the test. Then, you will drink a glucose solution that contains a specific amount of glucose. Your blood glucose will be measured again 1 and 2 hours after drinking the solution. This test takes 2 hours to complete. You will need to stay at the testing location during this time. During the testing period:  Do not eat or drink anything other than the glucose solution. You will be allowed to drink water.  Do not exercise.  Do not use any products that contain nicotine or tobacco. These products include cigarettes, chewing tobacco, and vaping devices, such as e-cigarettes. If you need help quitting, ask your health care provider. The testing procedure may vary among health care providers and hospitals. How are the results reported? Your test results will be reported as values. These will be given as milligrams of glucose per deciliter of blood (mg/dL) or millimoles per liter (mmol/L). Your health care provider will compare your results to normal ranges that were established after testing a large group of people (reference ranges). Reference ranges may vary among labs and hospitals. For this test, common reference ranges are:  Fasting: less than 110 mg/dL (6.1 mmol/L).  1 hour after drinking glucose: less than 180 mg/dL (10.0 mmol/L).  2 hours after drinking glucose: less than 140 mg/dL (7.8 mmol/L). What do the results mean? Results that are within the reference ranges are considered normal, meaning that your glucose levels are well controlled. Results higher than the reference ranges may mean that you recently experienced   stress, such as from an injury or a sudden (acute) condition like a heart attack or stroke, or that you have:  Diabetes.  Cushing syndrome.  Tumors such as pheochromocytoma or glucagonoma.  Kidney failure.  Pancreatitis.  Hyperthyroidism.  An infection. Talk with your health care provider about what your results mean. Questions to ask your health care  provider Ask your health care provider, or the department that is doing the test:  When will my results be ready?  How will I get my results?  What are my treatment options?  What other tests do I need?  What are my next steps? Summary  The GTT is done to check how your body processes glucose. This is one of several tests used to diagnose diabetes.  This test measures the amount of glucose in your blood at different times during a period of 2 hours. This indicates how well your body is able to process glucose.  Talk with your health care provider about what your results mean. This information is not intended to replace advice given to you by your health care provider. Make sure you discuss any questions you have with your health care provider. Document Revised: 08/16/2020 Document Reviewed: 08/16/2020 Elsevier Patient Education  2021 Elsevier Inc.  

## 2021-03-20 ENCOUNTER — Ambulatory Visit: Payer: BC Managed Care – PPO | Admitting: *Deleted

## 2021-03-20 ENCOUNTER — Other Ambulatory Visit: Payer: Self-pay

## 2021-03-20 ENCOUNTER — Encounter: Payer: Self-pay | Admitting: *Deleted

## 2021-03-20 ENCOUNTER — Ambulatory Visit (HOSPITAL_BASED_OUTPATIENT_CLINIC_OR_DEPARTMENT_OTHER): Payer: BC Managed Care – PPO | Admitting: Genetic Counselor

## 2021-03-20 ENCOUNTER — Ambulatory Visit: Payer: BC Managed Care – PPO | Attending: Obstetrics

## 2021-03-20 DIAGNOSIS — O321XX0 Maternal care for breech presentation, not applicable or unspecified: Secondary | ICD-10-CM

## 2021-03-20 DIAGNOSIS — Z315 Encounter for genetic counseling: Secondary | ICD-10-CM

## 2021-03-20 DIAGNOSIS — Z148 Genetic carrier of other disease: Secondary | ICD-10-CM | POA: Insufficient documentation

## 2021-03-20 DIAGNOSIS — O24112 Pre-existing diabetes mellitus, type 2, in pregnancy, second trimester: Secondary | ICD-10-CM | POA: Diagnosis not present

## 2021-03-20 DIAGNOSIS — Z3A25 25 weeks gestation of pregnancy: Secondary | ICD-10-CM

## 2021-03-20 DIAGNOSIS — Z3400 Encounter for supervision of normal first pregnancy, unspecified trimester: Secondary | ICD-10-CM | POA: Insufficient documentation

## 2021-03-20 DIAGNOSIS — Z362 Encounter for other antenatal screening follow-up: Secondary | ICD-10-CM | POA: Diagnosis not present

## 2021-03-20 DIAGNOSIS — D563 Thalassemia minor: Secondary | ICD-10-CM | POA: Insufficient documentation

## 2021-03-20 DIAGNOSIS — O99012 Anemia complicating pregnancy, second trimester: Secondary | ICD-10-CM

## 2021-03-20 NOTE — Progress Notes (Signed)
03/20/2021  Laurie Farmer 12/09/2000 MRN: [id number] DOV: 03/20/2021   Patient location: home [facility] Persons involved in the visit: patient, patient's mother, provider  I connected with Laurie Farmer on 03/20/2021 at 2:00 PM EST by WebEx and verified that I am speaking with the correct person using two identifiers. 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 Laurie Farmer was accompanied to her appointment by her mother, Laurie Farmer.   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 female. Her current pregnancy has completed [id number] (Estimated Date of Delivery: 07/03/21).  Laurie Farmer denied exposure to environmental toxins or chemical agents. She denied the use of alcohol or street drugs. She reported smoking one Black & Mild cigar every 2-3 weeks. She reported taking prenatal vitamins and aspirin. She denied significant viral illnesses, fevers, and bleeding during the course of her pregnancy. Her medical and surgical histories were noncontributory.  Prenatal exposure to tobacco can increase the risk for placenta previa and placental abruption, preterm birth, low birth weight, oral clefts, stillbirth, and sudden infant death syndrome (SIDS). Prenatal exposure to nicotine is also associated with an increased likelihood of neurological disorder and asthma. The risk of pregnancy complications associated with cigarette exposure tends to increase with the amount of cigarettes a woman smokes. It is never too late to quit smoking, and Laurie Farmer is encouraged to reduce the number of cigars smoked with the goal to eventually quit smoking altogether.  Family History  A three generation pedigree was  drafted and reviewed. The family history is remarkable for the following:  - Laurie Farmer has a one year old paternal half sister with Down syndrome. Down syndrome is most often caused by an entire extra copy of chromosome 21. This occurs due to errors in chromosomal division during the creation of egg and sperm cells in a process called nondisjunction. If Laurie Farmer's half sister has full trisomy 21 due to a nondisjunction event, there would not be an impact on Laurie Farmer's personal risk to have a pregnancy affected by Down syndrome, as this process occurs sporadically. However, every woman has an age-related risk for having a pregnancy affected by a chromosomal condition such as Down syndrome. Laurie Farmer had negative NIPS performed during this pregnancy, significantly decreasing the chance of the current fetus being affected by Down syndrome. See Discussion section for more details.  - Laurie Farmer mother reported a personal history of hypertension and anemia. She believes she is a carrier for alpha-thalassemia, but could not recall which type of carrier she is. She provided verbal consent for me to review her medical records for her testing report, but unfortunately I was unable to find it. Laurie Farmer mother also reported that she had a cousin with alpha-thalassemia who had medical problems and "looked different" and died at the age of 3 months. Further details on this individual and records are not available to confirm if the infant had a diagnosis of alpha-thalassemia or another condition. Without further information, risk assessment is limited.  - Laurie Farmer mother also reported that she has a first cousin with sickle cell disease. Sickle cell disease is inherited in an autosomal recessive pattern in which both parents must be carriers of the condition to be at risk of  having an affected child. We discussed that Laurie Farmer had carrier screening performed for several conditions, including sickle  cell disease. Her carrier screening did not identify her as a carrier of a hemoglobinopathy; thus, her risk of having a child with sickle cell disease is significantly reduced.  - Laurie Farmer has two distant paternal cousins who are "mentally slow/delayed". One individual is a female and the other is a female. Neither individual has worked, and Laurie Farmer mother believed that the female individual still lives at home with her parents in adulthood. It is possible that these individuals had an underlying genetic condition explaining their delays; however, without further details, risk assessment is limited.  The remaining family histories were reviewed and found to be noncontributory for birth defects, intellectual disability, recurrent pregnancy loss, and known genetic conditions. Laurie Farmer had limited information about portions of her partner's family history; thus, risk assessment was limited.   The patient's ancestry is African American. The father of the pregnancy's ancestry is African American. Ashkenazi Jewish ancestry and consanguinity were denied. Pedigree will be scanned under Media.  Discussion  Alpha-thalassemia:  Laurie Farmer was referred for genetic counseling as she had Horizon-4 carrier screening performed through [place] that identified her as a silent carrier for alpha-thalassemia (aa/a-). Alpha-thalassemia is different in its inheritance compared to other hemoglobinopathies as there are two copies of two alpha globin genes (HBA1 and HBA2) on each chromosome 16, or four alpha globin genes total (aa/aa). A person can be a carrier of one alpha gene mutation (aa/a-), also referred to as a "silent carrier". A person who carries two alpha globin gene mutations can either carry them in cis (both on the same chromosome, denoted as aa/--) or in trans (on different chromosomes, denoted as a-/a-). Alpha-thalassemia carriers of two mutations who have African American ancestry are more likely to  have a trans arrangement (a-/a-); cis configuration is reported to be rare in individuals with African American ancestry.     There are several different forms of alpha-thalassemia. The most severe form of alpha-thalassemia, Hb Barts, is associated with an absence of alpha globin chain synthesis as a result of deletions of all four alpha globin genes (--/--).  Given that Ms. Frogge is a silent carrier (aa/a-), her pregnancies would not be at increased risk for Hb Barts, even if her partner is a carrier for alpha-thalassemia, as she will always pass on at least one copy of the alpha globin gene to her children. Hemoglobin H (HbH) disease is caused by three deleted or dysfunctioning alpha globin alleles (a-/--) and is characterized by microcytic hypochromic hemolytic anemia, hepatosplenomegaly, mild jaundice, growth retardation, and sometimes thalassemia-like bone changes. Given Ms. Denunzio's silent carrier status (aa/a-), the current fetus would only be at risk for HbH disease (a-/--), if her partner is a carrier for two alpha globin mutations in cis (aa/--). If this is the case, the risk for HbH disease in the pregnancy would be 1 in 4 (25%). However, if Ms. Lumbra's partner is a carrier for two alpha globin mutations, he would be more likely to carry them in trans configuration (a-/a-) than the cis configuration (aa/--), given his ethnicity. If he is a carrier of alpha-thalassemia in trans, then the pregnancy would not be at increased risk for HbH disease. Based on the carrier frequency for alpha-thalassemia in the African American population, Ms. Kuroda's partner has a 1 in 30 chance of being any type of carrier for alpha-thalassemia. Thus, without partner carrier screening to refine  risk, the couple has a <1% chance of having a child with HbH disease.  Spinal muscular atrophy:  We also reviewed that Ms. Prazak was found to have 1 copy of the SMN1 gene on Horizon-4 carrier screening. 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 SMA is a condition caused by pathogenic variants in the SMN1 gene. SMA is characterized by progressive muscle weakness and atrophy due to degeneration and loss of anterior horn cells (lower motor neurons) in the spinal cord and brain stem. We discussed the different types of SMA, including differences in severity and age of onset. We also reviewed the autosomal recessive inheritance pattern of SMA.     We discussed that the current fetus is only at risk of being affected by SMA if Ms. Curless's partner is also a carrier for the condition. Based on the carrier frequency for SMA in the African American population, he currently has a 1 in 59 chance of being a carrier. If he were found to have 2 copies of the SMN1 gene, his risk of being a carrier would be reduced but not eliminated. Without partner carrier screening to refine risk, the couple's chance to have an affected child is 1 in 236 (0.4%). If Ms. Bambrick's partner is also a carrier for SMA, the couple would have a 1 in 4 (25%) chance of having an affected fetus with each of their pregnancies.   Other carrier screening results:  Ms. Goldsmith's carrier screening was negative for the other 2 conditions screened. Thus, her risk to be a carrier for these additional conditions (listed separately in the laboratory report) has been reduced but not eliminated. This also significantly reduces her risk of having a child affected by one of these conditions. We discussed that carrier testing for SMA and alpha-thalassemia is recommended for Ms. Bendorf's partner. Ms. Zinn indicated that she is not interested in pursuing partner carrier screening. She was informed that SMA, but not alpha-thalassemia, is included on [place] [facility]'s newborn screen.  Aneuploidy screening results:  We also reviewed that Ms. Blalock had [facility] noninvasive prenatal screening (NIPS) through the laboratory [facility] that was low-risk for fetal  aneuploidies. We reviewed that these results showed a less than 1 in 10,000 risk for trisomies 21, 18 and 13, and monosomy X (Turner syndrome).  In addition, the risk for triploidy and sex chromosome trisomies (47,XXX and 47,XXY) was also low. Ms. Ocanas elected to have cfDNA analysis for 22q11.2 deletion syndrome, which was also low risk (1 in 3100). We reviewed that while this testing identifies 94-99% of pregnancies with trisomy 19, trisomy 39, and trisomy 53, and >70% of cases of sex chromosome aneuploidies, it is NOT diagnostic. A positive test result requires confirmation by CVS or amniocentesis, and a negative test result does not rule out a fetal chromosome abnormality. She also understands that this testing does not identify all genetic conditions.  Diagnostic testing:  Ms. Krisher was also counseled regarding diagnostic testing via amniocentesis. We discussed the technical aspects of the procedure and quoted up to a 1 in 500 (0.2%) risk for preterm labor or other adverse pregnancy outcomes as a result of amniocentesis. Cultured cells from an amniocentesis sample allow for the visualization of a fetal karyotype, which can detect >99% of large chromosomal aberrations. Chromosomal microarray can also be performed to identify smaller deletions or duplications of fetal chromosomal material. Amniocentesis could also be performed to assess whether the baby is affected by alpha-thalassemia. After careful consideration, Ms. Storrs declined  amniocentesis at this time. She understands that amniocentesis is available at any point after 16 weeks of pregnancy and that she may opt to undergo the procedure at a later date should she change her mind.  Plan:  Ms. Bunner declined partner carrier screening for alpha-thalassemia and SMA. She felt comfortable with her chances of having a child with HbH disease or SMA based on her partner's ethnicity-based risk estimates. She understands that screening tests,  including ultrasound, cannot rule out all birth defects or genetic syndromes.   I counseled Ms. Herter regarding the above risks and available options. Second year [employer] genetic counseling student Liborio Nixon Davis-K[place]more participated in portions of today's session under my supervision. The approximate time with the genetic counselor was 50 minutes.  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

## 2021-03-27 ENCOUNTER — Ambulatory Visit: Payer: Self-pay

## 2021-03-28 ENCOUNTER — Other Ambulatory Visit: Payer: BC Managed Care – PPO

## 2021-03-28 ENCOUNTER — Other Ambulatory Visit: Payer: Self-pay

## 2021-03-28 DIAGNOSIS — Z3400 Encounter for supervision of normal first pregnancy, unspecified trimester: Secondary | ICD-10-CM

## 2021-03-28 DIAGNOSIS — Z3A26 26 weeks gestation of pregnancy: Secondary | ICD-10-CM

## 2021-03-28 NOTE — Progress Notes (Signed)
Patient sent to lab for 28 week labwork. Detrice Cales RN 

## 2021-03-29 LAB — GLUCOSE TOLERANCE, 2 HOURS W/ 1HR
Glucose, 1 hour: 92 mg/dL (ref 65–179)
Glucose, 2 hour: 85 mg/dL (ref 65–152)
Glucose, Fasting: 87 mg/dL (ref 65–91)

## 2021-03-29 LAB — CBC
Hematocrit: 30.1 % — ABNORMAL LOW (ref 34.0–46.6)
Hemoglobin: 9.8 g/dL — ABNORMAL LOW (ref 11.1–15.9)
MCH: 26 pg — ABNORMAL LOW (ref 26.6–33.0)
MCHC: 32.6 g/dL (ref 31.5–35.7)
MCV: 80 fL (ref 79–97)
Platelets: 286 10*3/uL (ref 150–450)
RBC: 3.77 x10E6/uL (ref 3.77–5.28)
RDW: 14.3 % (ref 11.7–15.4)
WBC: 8.6 10*3/uL (ref 3.4–10.8)

## 2021-03-29 LAB — HIV ANTIBODY (ROUTINE TESTING W REFLEX): HIV Screen 4th Generation wRfx: NONREACTIVE

## 2021-03-29 LAB — RPR: RPR Ser Ql: NONREACTIVE

## 2021-03-30 ENCOUNTER — Other Ambulatory Visit: Payer: Self-pay | Admitting: Family Medicine

## 2021-03-30 DIAGNOSIS — D509 Iron deficiency anemia, unspecified: Secondary | ICD-10-CM

## 2021-03-30 DIAGNOSIS — O99019 Anemia complicating pregnancy, unspecified trimester: Secondary | ICD-10-CM

## 2021-04-03 ENCOUNTER — Telehealth: Payer: Self-pay

## 2021-04-03 NOTE — Telephone Encounter (Signed)
Called pt to discuss results. Pt made aware that she is anemic and she is scheduled for her first iron infusion on 04/11/21 at 8 am. Understanding was voiced. Neala Miggins l Wilhelmine Krogstad, CMA

## 2021-04-03 NOTE — Telephone Encounter (Signed)
-----   Message from Levie Heritage, DO sent at 03/30/2021 12:03 PM EDT ----- Needs venofer weekly for 2 doses. Orders placed.

## 2021-04-10 ENCOUNTER — Other Ambulatory Visit: Payer: Self-pay

## 2021-04-10 ENCOUNTER — Encounter: Payer: Self-pay | Admitting: Advanced Practice Midwife

## 2021-04-10 ENCOUNTER — Ambulatory Visit (INDEPENDENT_AMBULATORY_CARE_PROVIDER_SITE_OTHER): Payer: BC Managed Care – PPO | Admitting: Advanced Practice Midwife

## 2021-04-10 VITALS — BP 120/73 | HR 107 | Wt 199.0 lb

## 2021-04-10 DIAGNOSIS — Z3400 Encounter for supervision of normal first pregnancy, unspecified trimester: Secondary | ICD-10-CM

## 2021-04-10 DIAGNOSIS — O99013 Anemia complicating pregnancy, third trimester: Secondary | ICD-10-CM

## 2021-04-10 DIAGNOSIS — O99019 Anemia complicating pregnancy, unspecified trimester: Secondary | ICD-10-CM | POA: Insufficient documentation

## 2021-04-10 DIAGNOSIS — Z23 Encounter for immunization: Secondary | ICD-10-CM

## 2021-04-10 DIAGNOSIS — O99012 Anemia complicating pregnancy, second trimester: Secondary | ICD-10-CM

## 2021-04-10 DIAGNOSIS — Z3A28 28 weeks gestation of pregnancy: Secondary | ICD-10-CM

## 2021-04-10 DIAGNOSIS — O285 Abnormal chromosomal and genetic finding on antenatal screening of mother: Secondary | ICD-10-CM

## 2021-04-10 DIAGNOSIS — O98212 Gonorrhea complicating pregnancy, second trimester: Secondary | ICD-10-CM

## 2021-04-10 NOTE — Patient Instructions (Signed)
 Glucose Tolerance Test Why am I having this test? The glucose tolerance test (GTT) is done to check how your body processes sugar (glucose). This is one of several tests used to diagnose diabetes (diabetes mellitus). Your health care provider may recommend this test if you:  Have a family history of diabetes.  Are obese.  Have infections that keep coming back.  Have had a lot of wounds that did not heal quickly, especially on your legs and feet.  Are a woman and have a history of giving birth to very large babies or a history of repeated fetal loss (stillbirth).  Have had high glucose levels in your urine or blood: ? During a past pregnancy. ? After a heart attack, surgery, or prolonged periods of high stress. What is being tested? This test measures the amount of glucose in your blood at different times during a period of 2 hours. This indicates how well your body is able to process glucose. What kind of sample is taken? Blood samples are required for this test. They are usually collected by inserting a needle into a blood vessel.   How do I prepare for this test?  For 3 days before your test, eat normally. Have plenty of carbohydrate-rich foods.  Follow instructions from your health care provider about: ? Eating or drinking restrictions on the day of the test. You may be asked to not eat or drink anything other than water (fast) starting 8-12 hours before the test. ? Changing or stopping your regular medicines. Some medicines may interfere with this test. Tell a health care provider about:  All medicines you are taking, including vitamins, herbs, eye drops, creams, and over-the-counter medicines.  Any blood disorders you have.  Any surgeries you have had.  Any medical conditions you have.  Whether you are pregnant or may be pregnant. What happens during the test? First, your blood glucose will be measured. This is referred to as your fasting blood glucose, since you  fasted before the test. Then, you will drink a glucose solution that contains a specific amount of glucose. Your blood glucose will be measured again 1 and 2 hours after drinking the solution. This test takes 2 hours to complete. You will need to stay at the testing location during this time. During the testing period:  Do not eat or drink anything other than the glucose solution. You will be allowed to drink water.  Do not exercise.  Do not use any products that contain nicotine or tobacco. These products include cigarettes, chewing tobacco, and vaping devices, such as e-cigarettes. If you need help quitting, ask your health care provider. The testing procedure may vary among health care providers and hospitals. How are the results reported? Your test results will be reported as values. These will be given as milligrams of glucose per deciliter of blood (mg/dL) or millimoles per liter (mmol/L). Your health care provider will compare your results to normal ranges that were established after testing a large group of people (reference ranges). Reference ranges may vary among labs and hospitals. For this test, common reference ranges are:  Fasting: less than 110 mg/dL (6.1 mmol/L).  1 hour after drinking glucose: less than 180 mg/dL (10.0 mmol/L).  2 hours after drinking glucose: less than 140 mg/dL (7.8 mmol/L). What do the results mean? Results that are within the reference ranges are considered normal, meaning that your glucose levels are well controlled. Results higher than the reference ranges may mean that you recently experienced   stress, such as from an injury or a sudden (acute) condition like a heart attack or stroke, or that you have:  Diabetes.  Cushing syndrome.  Tumors such as pheochromocytoma or glucagonoma.  Kidney failure.  Pancreatitis.  Hyperthyroidism.  An infection. Talk with your health care provider about what your results mean. Questions to ask your health care  provider Ask your health care provider, or the department that is doing the test:  When will my results be ready?  How will I get my results?  What are my treatment options?  What other tests do I need?  What are my next steps? Summary  The GTT is done to check how your body processes glucose. This is one of several tests used to diagnose diabetes.  This test measures the amount of glucose in your blood at different times during a period of 2 hours. This indicates how well your body is able to process glucose.  Talk with your health care provider about what your results mean. This information is not intended to replace advice given to you by your health care provider. Make sure you discuss any questions you have with your health care provider. Document Revised: 08/16/2020 Document Reviewed: 08/16/2020 Elsevier Patient Education  2021 Elsevier Inc.  

## 2021-04-10 NOTE — Progress Notes (Signed)
   PRENATAL VISIT NOTE  Subjective:  Laurie Farmer is a 20 y.o. G1P0 at [redacted]w[redacted]d being seen today for ongoing prenatal care.  She is currently monitored for the following issues for this low-risk pregnancy and has Supervision of normal first pregnancy, antepartum; Tinea corporis; Impaired glucose in pregnancy, antepartum; and Gonorrhea affecting pregnancy in second trimester on their problem list.  Patient reports no complaints.  Contractions: Not present. Vag. Bleeding: None.  Movement: Present. Denies leaking of fluid.   The following portions of the patient's history were reviewed and updated as appropriate: allergies, current medications, past family history, past medical history, past social history, past surgical history and problem list.   Objective:   Vitals:   04/10/21 1109  BP: 120/73  Pulse: (!) 107  Weight: 199 lb (90.3 kg)    Fetal Status: Fetal Heart Rate (bpm): 150   Movement: Present     General:  Alert, oriented and cooperative. Patient is in no acute distress.  Skin: Skin is warm and dry. No rash noted.   Cardiovascular: Normal heart rate noted  Respiratory: Normal respiratory effort, no problems with respiration noted  Abdomen: Soft, gravid, appropriate for gestational age.  Pain/Pressure: Absent     Pelvic: Cervical exam deferred        Extremities: Normal range of motion.  Edema: Trace  Mental Status: Normal mood and affect. Normal behavior. Normal judgment and thought content.   Assessment and Plan:  Pregnancy: G1P0 at [redacted]w[redacted]d 1. [redacted] weeks gestation of pregnancy     Wants to try [place]     Issues reviewed     Instructed to take class, get certificate, then will sign consent later     Informed Glucola normal  - Tdap vaccine greater than or equal to 7yo IM  2. Abnormal chromosomal and genetic finding on antenatal screening mother      Micah Flesher to [employer], FOB to be tested  3. Gonorrhea affecting pregnancy in second trimester    TOC neg  4)   Anemia,      Scheduled for Venofer tomorrow  Preterm labor symptoms and general obstetric precautions including but not limited to vaginal bleeding, contractions, leaking of fluid and fetal movement were reviewed in detail with the patient. Please refer to After Visit Summary for other counseling recommendations.   Return in about 2 weeks (around 04/24/2021) for [facility].  Future Appointments  Date Time Provider Department Center  04/11/2021  8:00 AM [facility] None  04/18/2021  8:00 AM [facility] None    Wynelle Bourgeois, CNM

## 2021-04-11 ENCOUNTER — Encounter (HOSPITAL_COMMUNITY)
Admission: RE | Admit: 2021-04-11 | Discharge: 2021-04-11 | Disposition: A | Discharge status: Home / Self Care | Payer: BC Managed Care – PPO | Source: Ambulatory Visit | Attending: Family Medicine | Admitting: Family Medicine

## 2021-04-11 DIAGNOSIS — O99019 Anemia complicating pregnancy, unspecified trimester: Secondary | ICD-10-CM | POA: Diagnosis not present

## 2021-04-11 DIAGNOSIS — D509 Iron deficiency anemia, unspecified: Secondary | ICD-10-CM | POA: Diagnosis present

## 2021-04-11 MED ORDER — SODIUM CHLORIDE 0.9 % IV SOLN: INTRAVENOUS | Status: DC | PRN

## 2021-04-11 MED ORDER — SODIUM CHLORIDE 0.9 % IV SOLN
300.0000 mg | INTRAVENOUS | Status: DC
  Administered 2021-04-11: 300 mg via INTRAVENOUS
  Filled 2021-04-11: qty 15

## 2021-04-11 MED ORDER — EPINEPHRINE PF 1 MG/ML IJ SOLN: 0.3000 mg | Freq: Once | INTRAMUSCULAR | Status: DC | PRN

## 2021-04-11 MED ORDER — SODIUM CHLORIDE 0.9 % IV BOLUS: 500.0000 mL | Freq: Once | INTRAVENOUS | Status: DC | PRN

## 2021-04-11 MED ORDER — DIPHENHYDRAMINE HCL 50 MG/ML IJ SOLN: 25.0000 mg | Freq: Once | INTRAMUSCULAR | Status: DC | PRN

## 2021-04-11 MED ORDER — METHYLPREDNISOLONE SODIUM SUCC 125 MG IJ SOLR: 125.0000 mg | Freq: Once | INTRAMUSCULAR | Status: DC | PRN

## 2021-04-11 MED ORDER — ALBUTEROL SULFATE (2.5 MG/3ML) 0.083% IN NEBU: 2.5000 mg | INHALATION_SOLUTION | Freq: Once | RESPIRATORY_TRACT | Status: DC | PRN

## 2021-04-11 NOTE — Discharge Instructions (Signed)
Iron Sucrose injection What is this medicine? IRON SUCROSE (AHY ern SOO krohs) is an iron complex. Iron is used to make healthy red blood cells, which carry oxygen and nutrients throughout the body. This medicine is used to treat iron deficiency anemia in people with chronic kidney disease. This medicine may be used for other purposes; ask your health care provider or pharmacist if you have questions. COMMON BRAND NAME(S): Venofer What should I tell my health care provider before I take this medicine? They need to know if you have any of these conditions:  anemia not caused by low iron levels  heart disease  high levels of iron in the blood  kidney disease  liver disease  an unusual or allergic reaction to iron, other medicines, foods, dyes, or preservatives  pregnant or trying to get pregnant  breast-feeding How should I use this medicine? This medicine is for infusion into a vein. It is given by a health care professional in a hospital or clinic setting. Talk to your pediatrician regarding the use of this medicine in children. While this drug may be prescribed for children as young as 2 years for selected conditions, precautions do apply. Overdosage: If you think you have taken too much of this medicine contact a poison control center or emergency room at once. NOTE: This medicine is only for you. Do not share this medicine with others. What if I miss a dose? It is important not to miss your dose. Call your doctor or health care professional if you are unable to keep an appointment. What may interact with this medicine? Do not take this medicine with any of the following medications:  deferoxamine  dimercaprol  other iron products This medicine may also interact with the following medications:  chloramphenicol  deferasirox This list may not describe all possible interactions. Give your health care provider a list of all the medicines, herbs, non-prescription drugs, or  dietary supplements you use. Also tell them if you smoke, drink alcohol, or use illegal drugs. Some items may interact with your medicine. What should I watch for while using this medicine? Visit your doctor or healthcare professional regularly. Tell your doctor or healthcare professional if your symptoms do not start to get better or if they get worse. You may need blood work done while you are taking this medicine. You may need to follow a special diet. Talk to your doctor. Foods that contain iron include: whole grains/cereals, dried fruits, beans, or peas, leafy green vegetables, and organ meats (liver, kidney). What side effects may I notice from receiving this medicine? Side effects that you should report to your doctor or health care professional as soon as possible:  allergic reactions like skin rash, itching or hives, swelling of the face, lips, or tongue  breathing problems  changes in blood pressure  cough  fast, irregular heartbeat  feeling faint or lightheaded, falls  fever or chills  flushing, sweating, or hot feelings  joint or muscle aches/pains  seizures  swelling of the ankles or feet  unusually weak or tired Side effects that usually do not require medical attention (report to your doctor or health care professional if they continue or are bothersome):  diarrhea  feeling achy  headache  irritation at site where injected  nausea, vomiting  stomach upset  tiredness This list may not describe all possible side effects. Call your doctor for medical advice about side effects. You may report side effects to FDA at 1-800-FDA-1088. Where should I keep   my medicine? This drug is given in a hospital or clinic and will not be stored at home. NOTE: This sheet is a summary. It may not cover all possible information. If you have questions about this medicine, talk to your doctor, pharmacist, or health care provider.  2021 Elsevier/Gold Standard (2011-08-29  17:14:35)     Pregnancy and Anemia  Anemia is a condition in which there is not enough red blood cells or hemoglobin in the blood. Hemoglobin is a substance in red blood cells that carries oxygen. When you do not have enough red blood cells or hemoglobin (are anemic), your body cannot get enough oxygen and your organs may not work properly. Anemia is common during pregnancy because your body needs more blood volume and blood cells to provide nutrition to the unborn baby. What are the causes? The most common cause of anemia during pregnancy is not having enough iron in the body to make red blood cells (iron deficiency anemia). Other causes may include:  Folic acid deficiency.  Vitamin B12 deficiency.  Certain prescription or over-the-counter medicines.  Certain medical conditions or infections that destroy red blood cells.  A low platelet count and bleeding caused by antibodies that go through the placenta to the baby from the mother's blood. What are the signs or symptoms? Mild anemia may not cause any symptoms. If anemia becomes severe, symptoms may include:  Feeling tired or weak.  Shortness of breath, especially during activity.  Fainting.  Pale skin.  Headaches.  A fast or irregular heartbeat.  Dizziness. How is this diagnosed? This condition may be diagnosed based on your medical history and a physical exam. You may also have blood tests. How is this treated? Treatment for anemia during pregnancy depends on the cause of the anemia. Treatment may include:  Making changes to your diet.  Taking iron, vitamin B12, or folic acid supplements.  Having a blood transfusion. This may be needed if the anemia is severe. Follow these instructions at home: Eating and drinking  Follow recommendations from your health care provider about changing your diet.  Eat a diet rich in iron. This would include foods such as: ? Liver. ? Beef. ? Eggs. ? Whole  grains. ? Spinach. ? Dried fruit.  Increase your vitamin C intake. This will help the stomach absorb more iron. Some foods that are high in vitamin C include: ? Oranges. ? Peppers. ? Tomatoes. ? Mangoes.  Eat green leafy vegetables. These are a good source of folic acid. General instructions  Take iron supplements and vitamins as told by your health care provider.  Keep all follow-up visits. This is important. Contact a health care provider if:  You have headaches that happen often or do not go away.  You bruise easily.  You have a fever.  You have nausea and vomiting for more than 24 hours.  You are unable to take supplements prescribed to treat your anemia. Get help right away if:  You develop signs or symptoms of severe anemia.  You have bleeding from your vagina.  You develop a rash.  You have bloody or tarry stools.  You are very dizzy or you faint. Summary  Anemia is a condition in which there is not enough red blood cells or hemoglobin in the blood.  The most common cause of anemia during pregnancy is not having enough iron in the body to make red blood cells (iron deficiency anemia).  Mild anemia may not cause any symptoms. If it becomes severe,  symptoms may include feeling tired and weak.  Take iron supplements and vitamins as told by your health care provider.  Keep all follow-up visits. This is important. This information is not intended to replace advice given to you by your health care provider. Make sure you discuss any questions you have with your health care provider. Document Revised: 03/21/2020 Document Reviewed: 03/21/2020 Elsevier Patient Education  2021 [employer].

## 2021-04-18 ENCOUNTER — Encounter (HOSPITAL_COMMUNITY)
Admission: RE | Admit: 2021-04-18 | Discharge: 2021-04-18 | Disposition: A | Discharge status: Home / Self Care | Payer: BC Managed Care – PPO | Source: Ambulatory Visit | Attending: Family Medicine | Admitting: Family Medicine

## 2021-04-18 ENCOUNTER — Other Ambulatory Visit: Payer: Self-pay

## 2021-04-18 DIAGNOSIS — O99019 Anemia complicating pregnancy, unspecified trimester: Secondary | ICD-10-CM | POA: Diagnosis not present

## 2021-04-18 DIAGNOSIS — D509 Iron deficiency anemia, unspecified: Secondary | ICD-10-CM

## 2021-04-18 MED ORDER — IRON SUCROSE 20 MG/ML IV SOLN
300.0000 mg | INTRAVENOUS | Status: AC
  Administered 2021-04-18: 300 mg via INTRAVENOUS
  Filled 2021-04-18: qty 15

## 2021-04-18 MED ORDER — METHYLPREDNISOLONE SODIUM SUCC 125 MG IJ SOLR: 125.0000 mg | Freq: Once | INTRAMUSCULAR | Status: DC | PRN

## 2021-04-18 MED ORDER — SODIUM CHLORIDE 0.9 % IV BOLUS: 500.0000 mL | Freq: Once | INTRAVENOUS | Status: DC | PRN

## 2021-04-18 MED ORDER — EPINEPHRINE PF 1 MG/ML IJ SOLN: 0.3000 mg | Freq: Once | INTRAMUSCULAR | Status: DC | PRN

## 2021-04-18 MED ORDER — ALBUTEROL SULFATE (2.5 MG/3ML) 0.083% IN NEBU: 2.5000 mg | INHALATION_SOLUTION | Freq: Once | RESPIRATORY_TRACT | Status: DC | PRN

## 2021-04-18 MED ORDER — DIPHENHYDRAMINE HCL 50 MG/ML IJ SOLN: 25.0000 mg | Freq: Once | INTRAMUSCULAR | Status: DC | PRN

## 2021-04-18 MED ORDER — SODIUM CHLORIDE 0.9 % IV SOLN: INTRAVENOUS | Status: DC | PRN

## 2021-04-24 ENCOUNTER — Encounter: Payer: Self-pay | Admitting: Medical

## 2021-04-24 ENCOUNTER — Other Ambulatory Visit: Payer: Self-pay

## 2021-04-24 ENCOUNTER — Ambulatory Visit (INDEPENDENT_AMBULATORY_CARE_PROVIDER_SITE_OTHER): Payer: BC Managed Care – PPO | Admitting: Medical

## 2021-04-24 VITALS — BP 120/69 | HR 112 | Wt 205.0 lb

## 2021-04-24 DIAGNOSIS — Z3A30 30 weeks gestation of pregnancy: Secondary | ICD-10-CM

## 2021-04-24 DIAGNOSIS — Z3400 Encounter for supervision of normal first pregnancy, unspecified trimester: Secondary | ICD-10-CM

## 2021-04-24 DIAGNOSIS — O99013 Anemia complicating pregnancy, third trimester: Secondary | ICD-10-CM

## 2021-04-24 NOTE — Progress Notes (Signed)
   PRENATAL VISIT NOTE  Subjective:  Laurie Farmer is a 20 y.o. G1P0 at [redacted]w[redacted]d being seen today for ongoing prenatal care.  She is currently monitored for the following issues for this low-risk pregnancy and has Supervision of normal first pregnancy, antepartum; Tinea corporis; Impaired glucose in pregnancy, antepartum; Gonorrhea affecting pregnancy in second trimester; and Anemia affecting pregnancy on their problem list.  Patient reports no complaints.  Contractions: Not present. Vag. Bleeding: None.  Movement: Present. Denies leaking of fluid.   The following portions of the patient's history were reviewed and updated as appropriate: allergies, current medications, past family history, past medical history, past social history, past surgical history and problem list.   Objective:   Vitals:   04/24/21 0820  BP: 120/69  Pulse: (!) 112  Weight: 205 lb (93 kg)    Fetal Status: Fetal Heart Rate (bpm): 139 Fundal Height: 33 cm Movement: Present     General:  Alert, oriented and cooperative. Patient is in no acute distress.  Skin: Skin is warm and dry. No rash noted.   Cardiovascular: Normal heart rate noted  Respiratory: Normal respiratory effort, no problems with respiration noted  Abdomen: Soft, gravid, appropriate for gestational age.  Pain/Pressure: Absent     Pelvic: Cervical exam deferred        Extremities: Normal range of motion.  Edema: Trace  Mental Status: Normal mood and affect. Normal behavior. Normal judgment and thought content.   Assessment and Plan:  Pregnancy: G1P0 at [redacted]w[redacted]d 1. Supervision of normal first pregnancy, antepartum - Doing well  - WB class information given - Discussed MOC, still unsure, possibly Depo  - Peds list given and importance of Peds discussed   2. Anemia affecting pregnancy in third trimester - Received Venofer x 2   3. [redacted] weeks gestation of pregnancy  Preterm labor symptoms and general obstetric precautions including but not limited to  vaginal bleeding, contractions, leaking of fluid and fetal movement were reviewed in detail with the patient. Please refer to After Visit Summary for other counseling recommendations.   Return in about 2 weeks (around 05/08/2021) for LOB, In-Person, Midwife preferred.  Future Appointments  Date Time Provider Department Center  05/08/2021 11:15 AM Aviva Signs, CNM [facility] None  05/22/2021 11:15 AM Aviva Signs, CNM [facility] None  06/05/2021 11:15 AM Aviva Signs, CNM [facility] None  06/12/2021 11:15 AM Aviva Signs, CNM [facility] None  06/19/2021 11:15 AM Aviva Signs, CNM [facility] None  06/26/2021 11:15 AM Aviva Signs, CNM [facility] None    Vonzella Nipple, PA-C

## 2021-04-24 NOTE — Patient Instructions (Addendum)
Fetal Movement Counts Patient Name: ________________________________________________ Patient Due Date: ____________________  What is a fetal movement count? A fetal movement count is the number of times that you feel your baby move during a certain amount of time. This may also be called a fetal kick count. A fetal movement count is recommended for every pregnant woman. You may be asked to start counting fetal movements as early as week 28 of your pregnancy. Pay attention to when your baby is most active. You may notice your baby's sleep and wake cycles. You may also notice things that make your baby move more. You should do a fetal movement count:  When your baby is normally most active.  At the same time each day. A good time to count movements is while you are resting, after having something to eat and drink. How do I count fetal movements? 1. Find a quiet, comfortable area. Sit, or lie down on your side. 2. Write down the date, the start time and stop time, and the number of movements that you felt between those two times. Take this information with you to your health care visits. 3. Write down your start time when you feel the first movement. 4. Count kicks, flutters, swishes, rolls, and jabs. You should feel at least 10 movements. 5. You may stop counting after you have felt 10 movements, or if you have been counting for 2 hours. Write down the stop time. 6. If you do not feel 10 movements in 2 hours, contact your health care provider for further instructions. Your health care provider may want to do additional tests to assess your baby's well-being. Contact a health care provider if:  You feel fewer than 10 movements in 2 hours.  Your baby is not moving like he or she usually does. Date: ____________ Start time: ____________ Stop time: ____________ Movements: ____________ Date: ____________ Start time: ____________ Stop time: ____________ Movements: ____________ Date: ____________  Start time: ____________ Stop time: ____________ Movements: ____________ Date: ____________ Start time: ____________ Stop time: ____________ Movements: ____________ Date: ____________ Start time: ____________ Stop time: ____________ Movements: ____________ Date: ____________ Start time: ____________ Stop time: ____________ Movements: ____________ Date: ____________ Start time: ____________ Stop time: ____________ Movements: ____________ Date: ____________ Start time: ____________ Stop time: ____________ Movements: ____________ Date: ____________ Start time: ____________ Stop time: ____________ Movements: ____________ This information is not intended to replace advice given to you by your health care provider. Make sure you discuss any questions you have with your health care provider. Document Revised: 07/08/2019 Document Reviewed: 07/08/2019 Elsevier Patient Education  2021 Elsevier Inc. Rosen's Emergency Medicine: Concepts and Clinical Practice (9th ed., pp. 2296- 2312). Elsevier.">  Braxton Hicks Contractions Contractions of the uterus can occur throughout pregnancy, but they are not always a sign that you are in labor. You may have practice contractions called Braxton Hicks contractions. These false labor contractions are sometimes confused with true labor. What are Braxton Hicks contractions? Braxton Hicks contractions are tightening movements that occur in the muscles of the uterus before labor. Unlike true labor contractions, these contractions do not result in opening (dilation) and thinning of the cervix. Toward the end of pregnancy (32-34 weeks), Braxton Hicks contractions can happen more often and may become stronger. These contractions are sometimes difficult to tell apart from true labor because they can be very uncomfortable. You should not feel embarrassed if you go to the hospital with false labor. Sometimes, the only way to tell if you are in true labor is for your   health care  provider to look for changes in the cervix. The health care provider will do a physical exam and may monitor your contractions. If you are not in true labor, the exam should show that your cervix is not dilating and your water has not broken. If there are no other health problems associated with your pregnancy, it is completely safe for you to be sent home with false labor. You may continue to have Braxton Hicks contractions until you go into true labor. How to tell the difference between true labor and false labor True labor  Contractions last 30-70 seconds.  Contractions become very regular.  Discomfort is usually felt in the top of the uterus, and it spreads to the lower abdomen and low back.  Contractions do not go away with walking.  Contractions usually become more intense and increase in frequency.  The cervix dilates and gets thinner. False labor  Contractions are usually shorter and not as strong as true labor contractions.  Contractions are usually irregular.  Contractions are often felt in the front of the lower abdomen and in the groin.  Contractions may go away when you walk around or change positions while lying down.  Contractions get weaker and are shorter-lasting as time goes on.  The cervix usually does not dilate or become thin. Follow these instructions at home:  Take over-the-counter and prescription medicines only as told by your health care provider.  Keep up with your usual exercises and follow other instructions from your health care provider.  Eat and drink lightly if you think you are going into labor.  If Braxton Hicks contractions are making you uncomfortable: ? Change your position from lying down or resting to walking, or change from walking to resting. ? Sit and rest in a tub of warm water. ? Drink enough fluid to keep your urine pale yellow. Dehydration may cause these contractions. ? Do slow and deep breathing several times an hour.  Keep  all follow-up prenatal visits as told by your health care provider. This is important.   Contact a health care provider if:  You have a fever.  You have continuous pain in your abdomen. Get help right away if:  Your contractions become stronger, more regular, and closer together.  You have fluid leaking or gushing from your vagina.  You pass blood-tinged mucus (bloody show).  You have bleeding from your vagina.  You have low back pain that you never had before.  You feel your baby's head pushing down and causing pelvic pressure.  Your baby is not moving inside you as much as it used to. Summary  Contractions that occur before labor are called Braxton Hicks contractions, false labor, or practice contractions.  Braxton Hicks contractions are usually shorter, weaker, farther apart, and less regular than true labor contractions. True labor contractions usually become progressively stronger and regular, and they become more frequent.  Manage discomfort from [facility] contractions by changing position, resting in a warm bath, drinking plenty of water, or practicing deep breathing. This information is not intended to replace advice given to you by your health care provider. Make sure you discuss any questions you have with your health care provider. Document Revised: 10/31/2017 Document Reviewed: 04/03/2017 Elsevier Patient Education  2021 [employer].    Considering [place]? Guide for patients at [facility]) Why consider waterbirth? . Gentle birth for babies  . Less pain medicine used in labor  . May allow for passive descent/less  pushing  . May reduce perineal tears  . More mobility and instinctive maternal position changes  . Increased maternal relaxation   Is waterbirth safe? What are the risks of infection, drowning or other complications? . Infection:  Marland Kitchen Very low risk (3.7 % for tub vs 4.8% for bed)  . 7 in 8000 waterbirths with  documented infection  . Poorly cleaned equipment most common cause  . Slightly lower group B strep transmission rate  . Drowning  . Maternal:  . Very low risk  . Related to seizures or fainting  . Newborn:  Marland Kitchen Very low risk. No evidence of increased risk of respiratory problems in multiple large studies  . Physiological protection from breathing under water  . Avoid underwater birth if there are any fetal complications  . Once baby's head is out of the water, keep it out.  . Birth complication  . Some reports of cord trauma, but risk decreased by bringing baby to surface gradually  . No evidence of increased risk of shoulder dystocia. Mothers can usually change positions faster in water than in a bed, possibly aiding the maneuvers to free the shoulder.   There are 2 things you MUST do to have a waterbirth with [facility]: 1. Attend a waterbirth class at [facility] at [facility]   a. 3rd Wednesday of every month from 7-9 pm (virtual during COVID) b. Free c. Register online at [email or web address] or [email or web address] or by calling [phone] d. Bring [place] the certificate from the class to your prenatal appointment or send via MyChart 2. Meet with a midwife at 36 weeks* to see if you can still plan a waterbirth and to sign the consent.   *We also recommend that you schedule as many of your prenatal visits with a midwife as possible.    Helpful information: . You may want to bring a bathing suit top to the hospital to wear during labor but this is optional.  All other supplies are provided by the hospital. . Please arrive at the hospital with signs of active labor, and do not wait at home until late in labor. It takes 45 min- 2 hours for COVID testing, fetal monitoring, and check in to your room to take place, plus transport and filling of the waterbirth tub.    Things that would prevent you from having a waterbirth: . Unknown or Positive COVID-19 diagnosis upon  admission to hospital* . Premature, <37wks  . Previous cesarean birth  . Presence of thick meconium-stained fluid  . Multiple gestation (Twins, triplets, etc.)  . Uncontrolled diabetes or gestational diabetes requiring medication  . Hypertension diagnosed in pregnancy or preexisting hypertension (gestational hypertension, preeclampsia, or chronic hypertension) . Fetal growth restriction (your baby measures less than 10th percentile on ultrasound) . Heavy vaginal bleeding  . Non-reassuring fetal heart rate  . Active infection (MRSA, etc.). Group B Strep is NOT a contraindication for waterbirth.  . If your labor has to be induced and induction method requires continuous monitoring of the baby's heart rate  . Other risks/issues identified by your obstetrical provider   Please remember that birth is unpredictable. Under certain unforeseeable circumstances your provider may advise against giving birth in the tub. These decisions will be made on a case-by-case basis and with the safety of you and your baby as our highest priority.   *Please remember that in order to have a waterbirth, you must test Negative to COVID-19 upon admission to the hospital.  Updated 03/12/21  AREA PEDIATRIC/FAMILY PRACTICE PHYSICIANS  [place] ([id number]) . [facility] Melodie Bouillono Chambliss, MD; Lum BabeEniola, MD; Sheffield SliderHale, MD; Leveda AnnaHensel, MD; McDiarmid, MD; Jerene BearsMcIntyer, MD; Jennette KettleNeal, MD; Gwendolyn GrantWalden, MD o [address] [place], [place] [address] o [phone] o Mon-Fri 8:30-12:30, 1:30-5:00 o Providers come to see babies at [facility] o Accepting Medicaid . [facility] o Limited providers who accept newborns: Docia ChuckKoirala, MD; Kateri PlummerMorrow, MD; Paulino RilyWolters, MD o [address], [place], [place] [address] o [phone] o Mon-Fri 8:00-5:30 o Babies seen by providers at [facility] o Does NOT accept Medicaid o Please call early in hospitalization for appointment (limited  availability)  . [facility] Fatima Sangero Mul[place], MD o [address] [place], [place] [address] o [phone] o Mon, Tue, Thur, Fri 8:30-5:00, Wed 10:00-7:00 (closed 1-2pm) o Babies seen by [facility] providers o Accepting Medicaid . Donnie Coffinubin - Pediatrician Fae Pippino Rubin, MD o [address], [place], [place] [address] o [phone] o Mon-Fri 8:30-5:00, Sat 8:30-12:00 o Provider comes to see babies at [facility] o Accepting Medicaid o Must have been referred from current patients or contacted office prior to delivery . [facility] Leotis Paino Brown, MD; Ave Filterhandler, MD; Luna FuseEttefagh, MD; Kennedy BuckerGrant, MD; Konrad DoloresLester, MD; Kathlene NovemberMcCormick, MD; Jenne CampusMcQueen, MD; Lubertha SouthProse, MD; Wynetta EmerySimha, MD; Duffy RhodyStanley, MD; Gerre CouchStryffeler, NP; Shirl Harrisebben, NP o [address], [place], [place] [address] o [phone] o Mon, Tue, Thur, Fri 8:30-5:30, Wed 9:30-5:30, Sat 8:30-12:30 o Babies seen by [facility] providers o Accepting Medicaid o Only accepting infants of first-time parents or siblings of current patients [facility] discharge coordinator will make follow-up appointment . Cyril MourningJack Amos o [address], [place], [place]  [address] o [phone]   Fax - [phone] . [facility] o [address], [place], [place]  [address] o Phone - [phone]   Fax - [phone] . Lucio EdwardShilpa Gosrani o [address], [place], [place]  [address] o [phone]  [place] [phone]) . [facility] Jorge Mandrilo Bates, MD; Alita ChyleBrassfield, MD; Princella Ionooper, Cox, MD; MD; Earlene Plateravis, MD; Jamesetta Orleansovico, MD; Alvera NovelEttefaugh, MD; Clarene DukeLittle, MD; Rana SnareLowe, MD; Carmon GinsbergKeiffer, MD; Alinda MoneyMelvin, MD; Hosie PoissonSumner, MD; Mayford KnifeWilliams, MD o [address], [place], [place] [address] o [phone] o Mon-Fri 8:30-5:00 (extended evenings Mon-Thur as needed), Sat-Sun 10:00-1:00 o Providers come to see babies at [facility] o Accepting Medicaid for families of first-time babies and  families with all children in the household age 833 and under. Must register with office prior to making appointment (M-F only). Alric Quan. [facility] Odella Aquaso Henson, NP; Lynelle DoctorKnapp, MD; Susann GivensLalonde, MD; Cochrane[place]inger, [place] o [address] [place], [place] [address] o [phone] o Mon-Fri 8:00-5:00 o Babies seen by providers at [facility] o Does NOT accept Medicaid/Commercial Insurance Only . [facility] [place] ([facility])  Suzette Battiesto Artis, MD; Zachery DauerBarnes, MD; Stefan ChurchBratton, MD; Sabino Dickoccaro, MD; Quitman LivingsLockett Gardner, MD; Farris HasKramer, MD; Gaynell FaceMarshall, MD; Betha LoaNetherton, MD; Colon FlatteryPoleto, MD; Clifton JamesSkinner, MD o [address] [place], [place] [address] o [phone] o Mon-Fri 8:30-5:30, Sat (Oct.-Mar.) 9:00-1:00 o Babies seen by providers at [facility] o Accepting [facility]  [place] [phone] . [facility] [place] [place] o Reid, MD; Sheliah HatchWarner, MD o [address], [place], [place] [address] o [phone] o Mon-Fri 8:30-5:00, Sat 8:30-12:00 o Providers come to see babies at [facility] o Does NOT accept Medicaid . [facility] Cindy Hazy, PA; Tracie Harrier, MD; [place], [place]; Wynelle Link, MD; Azucena Cecil, MD o [address], [place], [place] [address] o [phone] o Mon-Fri 8:00-5:00 o Babies seen by providers at [facility] o Does NOT accept Medicaid o Only accepting babies of parents who are patients o Please call early in hospitalization for appointment (limited availability) . [facility] Pediatricians Lamar Benes, MD; Abran Cantor, MD; Early Osmond, MD; Cherre Huger, NP; Hyacinth Meeker, MD; Dwan Bolt, MD; Jarold Motto, NP; Dario Guardian, MD; Talmage Nap, MD; Maisie Fus, MD; Pricilla Holm, MD; Tama High, MD o [address] [place], [place], [place] [address] o [phone] o Mon-Fri 8:00-5:00, Sat 9:00-12:00 o Providers come to see babies at [facility] o Does NOT accept [facility] [id number]) . [facility] o Limited providers accepting new  patients: Drema Pry, NP; [place], PA o [address] [place], [place] [address] o [phone] o Mon-Fri 8:00-5:00 o Babies seen by providers at [facility] o Does NOT accept Medicaid o Only accepting babies of parents who are patients o Please call early in hospitalization for appointment (limited availability) . [facility] Luan Pulling, MD; Nash Dimmer, MD o [address] [place] [place], [place] [address] o [phone] (press 1 to schedule appointment) o Mon-Fri 8:00-5:00 o Providers come to see babies at [facility] o Does NOT accept Medicaid . [facility] Cristino Martes, MD o [address], [place], [place] [address] o [phone] o Mon-Fri 8:30-5:00 (lunch 12:30-1:00), extended hours by appointment only Wed 5:00-6:30 o Babies seen by [facility] providers o Accepting Medicaid . [facility], MD; [place], MD; Hassan Rowan, MD o [address], [place], [place] [address] o [phone] o Mon-Fri 8:00-5:00 o Babies seen by [facility] providers o Does NOT accept Medicaid . Nature conservation officer at [place] [address] Elsworth Soho, MD; Durene Cal, MD; [place], DO o [address] [place], [place] [address] o [phone] o Mon-Fri 8:00-5:00 o Babies seen by [facility] providers o Does NOT accept Medicaid . [facility] o [place], [place]; [place], [place]; [place]in[place], NP; Avis Epley, MD; Vonna Kotyk, MD; Clance Boll, MD; Stevphen Rochester, NP; Arvilla Market, NP; Ann Maki, NP; Otis Dials, NP; Vaughan Basta, MD; [place], MD o [address] [place], [place] [address] o [phone] o Mon-Fri 8:30-5:00, Sat 10:00-1:00 o Providers come to see babies at [facility] o Does NOT accept Medicaid o Free prenatal information session Tuesdays at 4:45pm . [facility] Luna Kitchens, MD; [place], [place]; [place], [place]; [place], [place] o [address] [place] [address] o [phone] o Mon-Fri 7:30-5:30 o Babies seen by [facility] providers . [facility] o [address], [place], [place]  [address] o [phone]   Fax - [phone]  [place] [phone] & [phone]) . [facility] Alphonsa Overall, MD o [address] [place], [place] [address] o [phone] o Mon-Thur 8:00-6:00 o Providers come to see babies at [facility] o Accepting Medicaid . [facility] Zenon Mayo, NP; Cyndia Bent, MD; [place], [place]; [place], [place] o [address] [place], [place] [address] o [phone] o Mon-Thur 7:30-7:30, Fri 7:30-4:30 o Babies seen by [facility] providers o Accepting Medicaid . [facility] Cheryle Horsfall, MD; Janene Harvey, NP; Vonita Moss, MD o [address], [place], [place] [address] o [phone] o Mon-Fri 8:30-5:00, Sat 8:30-12:00 o Providers come to see babies at [facility] o Accepting Medicaid o Must have "Meet & Greet" appointment at office prior to delivery . [facility]in[facility] Otto [facility]) o  Marlow Baars, MD; Earlene Plater, MD; Lucretia Roers, MD o [address], [place], [place] [address] o [phone] o Mon-Wed 8:00-6:00, Thur-Fri 8:00-5:00, Sat 9:00-12:00 o Providers come to see babies at [facility] o Does NOT accept Medicaid o Only accepting siblings of current patients . [facility]  o [address], [place], [place]  [address] o [phone]   Fax - [phone] . [facility] at [facility] o [id number], [place], [place]  [address] o [phone]   Fax - [phone]  [place] [phone] & [phone]) . Nature conservation officer at [facility] o [place], DO; [place], DO o [address] [place], [place] [address] o [phone] o Mon-Fri 7:00-5:00 o Babies seen by [facility] providers o Does NOT accept Medicaid . [facility] Ellis Savage, MD; [place], [place]; [place], [place] o [address], [place], [place] [address] o [phone] o Mon-Fri 8:00-5:00 o Babies seen by [facility] providers o Accepting Medicaid . [facility] - [address] Franne Forts, MD; [place], [place]; [place], NP; [place], [place] o [address] [place], [place], [place] [address] o [phone] o Mon-Fri 8:00-5:00 o Babies seen by providers at [facility] o Accepting [facility] [place] [phone]) . [facility] [place], [place] o [address] [place], [place] [address] o [phone] o Mon-Fri 8:00-5:00 o Babies seen by [facility] providers o Does NOT accept Medicaid o Limited availability, please call early in hospitalization to schedule follow-up . [facility] Jolee Ewing, PA; Eddie Candle, MD; [place], MD; [place], [place]; Constance Goltz, MD; [place], [place] o [facility] [facility] [address], [place], [place] [address] o [phone] o Mon-Fri 8:30-5:00, Sat 9:00-12:00 o Babies seen by providers at [facility] o Accepting Medicaid o Please register online then schedule online or call office o [email or web address] . [facility]) Samuella Bruin, NP; Lucianne Muss, MD; Lanier Clam, PA o [address], [place], [place] [address] o [phone] o Mon-Fri 8:00-5:00 o Babies seen by providers at [facility] o Accepting Medicaid . [facility] ([facility]) Sharin Mons, MD; Reed Breech, NP; Shelva Majestic, MD o [address], [place], [place] [address] o [phone] o Mon-Fri 8:00-5:30, Sat&Sun by appointment (phones open at 8:30) o Babies seen by [facility] providers o Accepting Medicaid o Must be a first-time baby or sibling of current patient . [facility] [address], [place], [place]  [address] o [phone]   Fax - [phone]  [place] [phone] & [phone]) . [facility] o [place], [place]; [place], [place]; Dimple Casey, MD; [place], [place]; Carolyne Fiscal, MD o [address] [place], [place] [address] o [phone] o Mon-Thur 8:00-7:00, Fri 8:00-5:00, Sat 8:00-12:00, Sun 9:00-12:00 o Babies seen by [name] providers o Accepting Medicaid . [facility] at [facility], MD; Gaynell Face, MD; [facility], MD o [address], [place], [place] [address] o [phone] o Mon-Thur 8:00-5:00 o Babies seen by providers at [facility] o Accepting Medicaid . [facility] [place] Gwenlyn Saran, MD; [place], MD; Madilyn Fireman, MD; Melvyn Neth, MD; List, MD; Lazarus Salines, MD; Gaynell Face, MD; Berneda Rose, MD; Flora Lipps, MD; Beryl Meager, MD; Luther Redo, MD; Lavonia Drafts, MD; Kellie Simmering, MD o [address] [place] [place],  [place] [address] o [phone] o Mon-Fri 8:00-5:30, Sat (Oct.-Mar.) 9:00-1:00 o Babies seen by providers at [facility] o Accepting Medicaid o Must fill out new patient packet, available online at [email or web address] . [facility] [place] at [facility]) Simone Curia, NP; Tiburcio Pea, NP; Tresa Endo, NP; Whitney Post, MD; [place], [place]; Hennie Duos, MD; Wynne Dust, MD; Kavin Leech, NP o [address], [place], [place] [address] o [phone] o Mon-Thur 8:00-5:30, Fri 8:00-5:00 o Babies seen by providers at [facility] o Accepting [facility] [phone]) . [facility] o [place], [place]; [employer], MD; Tanya Nones, MD; [place], [place] o [address] [place], [place] [address] o [phone] o Mon-Fri 8:00-5:00 o Babies seen by providers at [facility] o Accepting [facility] [phone]) . [facility] at [facility] o [place], DO; Lenise Arena, MD; [place], [place] o [address], [place], [place] [address] o [phone] o Mon-Fri 8:00-5:00 o Babies seen by providers at [facility] o Does NOT accept Medicaid o Limited  appointment availability, please call early in hospitalization  . Nature conservation officer at [facility] o [place], DO; [place], MD o [address] [place], [place] [address] o [phone] o Mon-Fri 8:00-5:00 o Babies seen by [facility] providers o Does NOT accept Medicaid . [facility] Lorrine Kin, MD; Ninetta Lights, MD; [place], [place]; [place], MD o [address], [place], [place] [address] o [phone] o Mon-Fri 8:00-5:00 o After hours clinic [facility] [place], [place] [address]) [phone] Mon-Fri 5:00-8:00, Sat 12:00-6:00, Sun 10:00-4:00 o Babies seen by [facility] providers o Accepting Medicaid . [facility] at [facility] o [address] [place], [place]  [address] o [phone]   Fax - [phone]  [place] [phone]) . Nature conservation officer at [facility], MD o [address] [place], [place], [place] [address] o [phone] o Mon-Fri 8:00-5:00 o Babies seen by [facility] providers o Does NOT accept Medicaid . [facility] - [place] [facility] [place]) Tomi Likens, MD o [address] [place] [address], [place], [place] [address] o [phone] o Mon-Thur 8:00-7:00, Fri 8:00-5:00, Sat 8:00-12:00 o Babies seen by providers at [facility] o Accepting Medicaid - but does not have vaccinations in office (must be received elsewhere) o Limited availability, please call early in hospitalization  [place] ([phone]) . [facility]  o Wyvonne Lenz, MD o [address], [place] [address] o [phone]  Fax [phone]

## 2021-05-08 ENCOUNTER — Encounter: Payer: Self-pay | Admitting: Advanced Practice Midwife

## 2021-05-08 ENCOUNTER — Ambulatory Visit (INDEPENDENT_AMBULATORY_CARE_PROVIDER_SITE_OTHER): Payer: BC Managed Care – PPO | Admitting: Advanced Practice Midwife

## 2021-05-08 ENCOUNTER — Other Ambulatory Visit: Payer: Self-pay

## 2021-05-08 VITALS — BP 153/76 | HR 106 | Wt 206.0 lb

## 2021-05-08 DIAGNOSIS — O1203 Gestational edema, third trimester: Secondary | ICD-10-CM

## 2021-05-08 DIAGNOSIS — O163 Unspecified maternal hypertension, third trimester: Secondary | ICD-10-CM

## 2021-05-08 DIAGNOSIS — R030 Elevated blood-pressure reading, without diagnosis of hypertension: Secondary | ICD-10-CM

## 2021-05-08 DIAGNOSIS — O26843 Uterine size-date discrepancy, third trimester: Secondary | ICD-10-CM

## 2021-05-08 NOTE — Patient Instructions (Signed)
Hypertension During Pregnancy Hypertension is also called high blood pressure. High blood pressure means that the force of the blood moving in your body is high enough to cause problems for you and your baby. Different types of high blood pressure can happen during pregnancy. The types are:  High blood pressure before you got pregnant. This is called chronic hypertension.  This can continue during your pregnancy. Your doctor will want to keep checking your blood pressure. You may need medicine to control your blood pressure while you are pregnant. You will need follow-up visits after you have your baby.  High blood pressure that goes up during pregnancy when it was normal before. This is called gestational hypertension. It will often get better after you have your baby, but your doctor will need to watch your blood pressure to make sure that it is getting better.  You may develop high blood pressure after giving birth. This is called postpartum hypertension. This often occurs within 48 hours after childbirth but may occur up to 6 weeks after giving birth. Very high blood pressure during pregnancy is an emergency that needs treatment right away. How does this affect me? If you have high blood pressure during pregnancy, you have a higher chance of developing high blood pressure:  As you get older.  If you get pregnant again. In some cases, high blood pressure during pregnancy can cause:  Stroke.  Heart attack.  Damage to the kidneys, lungs, or liver.  Preeclampsia.  HELLP syndrome.  Seizures.  Problems with the placenta. How does this affect my baby? Your baby may:  Be born early.  Not weigh as much as he or she should.  Not handle labor well, leading to a C-section. This condition may also result in a baby's death before birth (stillbirth). What are the risks?  Having high blood pressure during a past pregnancy.  Being overweight.  Being age 35 or older.  Being pregnant  for the first time.  Being pregnant with more than one baby.  Becoming pregnant using fertility methods, such as IVF.  Having other problems, such as diabetes or kidney disease. What can I do to lower my risk?  Keep a healthy weight.  Eat a healthy diet.  Follow what your doctor tells you about treating any medical problems that you had before you got pregnant. It is very important to go to all of your doctor visits. Your doctor will check your blood pressure and make sure that your pregnancy is progressing as it should. Treatment should start early if a problem is found.   How is this treated? Treatment for high blood pressure during pregnancy can vary. It depends on the type of high blood pressure you have and how serious it is.  If you were taking medicine for your blood pressure before you got pregnant, talk with your doctor. You may need to change the medicine during pregnancy if it is not safe for your baby.  If your blood pressure goes up during pregnancy, your doctor may order medicine to treat this.  If you are at risk for preeclampsia, your doctor may tell you to take a low-dose aspirin while you are pregnant.  If you have very high blood pressure, you may need to stay in the hospital so you and your baby can be watched closely. You may also need to take medicine to lower your blood pressure.  In some cases, if your condition gets worse, you may need to have your baby early.   Follow these instructions at home: Eating and drinking  Drink enough fluid to keep your pee (urine) pale yellow.  Avoid caffeine.   Lifestyle  Do not smoke or use any products that contain nicotine or tobacco. If you need help quitting, ask your doctor.  Do not use alcohol or drugs.  Avoid stress.  Rest and get plenty of sleep.  Regular exercise can help. Ask your doctor what kinds of exercise are best for you. General instructions  Take over-the-counter and prescription medicines only as  told by your doctor.  Keep all prenatal and follow-up visits. Contact a doctor if:  You have symptoms that your doctor told you to watch for, such as: ? Headaches. ? A feeling like you may vomit (nausea). ? Vomiting. ? Belly (abdominal) pain. ? Feeling dizzy or light-headed. Get help right away if:  You have symptoms of serious problems, such as: ? Very bad belly pain that does not get better with treatment. ? A very bad headache that does not get better. ? Blurry vision. ? Double vision. ? Vomiting that does not get better. ? Sudden, fast weight gain. ? Sudden swelling in your hands, ankles, or face. ? Bleeding from your vagina. ? Blood in your pee. ? Shortness of breath. ? Chest pain. ? Weakness on one side of your body. ? Trouble talking.  Your baby is not moving as much as usual. These symptoms may be an emergency. Get help right away. Call your local emergency services (911 in the [place]  Do not wait to see if the symptoms will go away.  Do not drive yourself to the hospital. Summary  High blood pressure is also called hypertension.  High blood pressure means that the force of the blood moving in your body is high enough to cause problems for you and your baby.  Get help right away if you have symptoms of serious problems due to high blood pressure.  Keep all prenatal and follow-up visits. This information is not intended to replace advice given to you by your health care provider. Make sure you discuss any questions you have with your health care provider. Document Revised: 08/10/2020 Document Reviewed: 08/10/2020 Elsevier Patient Education  2021 Elsevier Inc.  

## 2021-05-08 NOTE — Progress Notes (Signed)
First BP 144/76. Repeat BP 153/76. Pt denies headache or visual changes.

## 2021-05-08 NOTE — Progress Notes (Signed)
   PRENATAL VISIT NOTE  Subjective:  Laurie Farmer is a 20 y.o. G1P0 at 101w0d being seen today for ongoing prenatal care.  She is currently monitored for the following issues for this low-risk pregnancy and has Supervision of normal first pregnancy, antepartum; Tinea corporis; Impaired glucose in pregnancy, antepartum; Gonorrhea affecting pregnancy in second trimester; Anemia affecting pregnancy; and Borderline systolic hypertension on their problem list.  Patient reports no complaints.  Contractions: Not present. Vag. Bleeding: None.  Movement: Present. Denies leaking of fluid.   The following portions of the patient's history were reviewed and updated as appropriate: allergies, current medications, past family history, past medical history, past social history, past surgical history and problem list.   Objective:   Vitals:   05/08/21 1057 05/08/21 1058  BP: (!) 144/76 (!) 153/76  Pulse: (!) 102 (!) 106  Weight: 206 lb (93.4 kg)     Fetal Status: Fetal Heart Rate (bpm): 148 Fundal Height: 36 cm Movement: Present     General:  Alert, oriented and cooperative. Patient is in no acute distress.  Skin: Skin is warm and dry. No rash noted.   Cardiovascular: Normal heart rate noted  Respiratory: Normal respiratory effort, no problems with respiration noted  Abdomen: Soft, gravid, appropriate for gestational age.  Pain/Pressure: Absent     Pelvic: Cervical exam deferred        Extremities: Normal range of motion.  Edema: Trace   DTRs 2+, no clonus   Mental Status: Normal mood and affect. Normal behavior. Normal judgment and thought content.   Assessment and Plan:  Pregnancy: G1P0 at [redacted]w[redacted]d 1. Elevated blood pressure affecting pregnancy in third trimester, antepartum      Systolic elevations only      No headache or visual changes      Has had some new pedal edema      Will do labs today and recheck BP in 2 days      Discussed course  Of care if she develops GHTN - Comprehensive  metabolic panel - CBC - Protein / creatinine ratio, urine - [place] MFM OB FOLLOW UP; Future  2. Edema during pregnancy in third trimester  - [place] MFM OB FOLLOW UP; Future  3. Borderline systolic hypertension  - [place] MFM OB FOLLOW UP; Future  4. Size of fetus inconsistent with dates in third trimester      Ordered US for growth - [place] MFM OB FOLLOW UP; Future  Preterm labor symptoms and general obstetric precautions including but not limited to vaginal bleeding, contractions, leaking of fluid and fetal movement were reviewed in detail with the patient. Please refer to After Visit Summary for other counseling recommendations.   Return in about 2 days (around 05/10/2021) [facility] For [facility] check.  Future Appointments  Date Time Provider Department Center  05/10/2021 11:15 AM [facility] NURSE [facility] None  05/22/2021 11:15 AM Aviva Signs, CNM [facility] None  06/05/2021 11:15 AM Aviva Signs, CNM [facility] None  06/12/2021 11:15 AM Aviva Signs, CNM [facility] None  06/19/2021 11:15 AM Aviva Signs, CNM [facility] None  06/26/2021 11:15 AM Aviva Signs, CNM [facility] None    Wynelle Bourgeois, CNM

## 2021-05-09 LAB — COMPREHENSIVE METABOLIC PANEL
ALT: 8 IU/L (ref 0–32)
AST: 11 IU/L (ref 0–40)
Albumin/Globulin Ratio: 1.2 (ref 1.2–2.2)
Albumin: 3.5 g/dL — ABNORMAL LOW (ref 3.9–5.0)
Alkaline Phosphatase: 112 IU/L — ABNORMAL HIGH (ref 42–106)
BUN/Creatinine Ratio: 9 (ref 9–23)
BUN: 5 mg/dL — ABNORMAL LOW (ref 6–20)
Bilirubin Total: 0.2 mg/dL (ref 0.0–1.2)
CO2: 17 mmol/L — ABNORMAL LOW (ref 20–29)
Calcium: 8.9 mg/dL (ref 8.7–10.2)
Chloride: 102 mmol/L (ref 96–106)
Creatinine, Ser: 0.53 mg/dL — ABNORMAL LOW (ref 0.57–1.00)
Globulin, Total: 3 g/dL (ref 1.5–4.5)
Glucose: 83 mg/dL (ref 65–99)
Potassium: 4.2 mmol/L (ref 3.5–5.2)
Sodium: 133 mmol/L — ABNORMAL LOW (ref 134–144)
Total Protein: 6.5 g/dL (ref 6.0–8.5)
eGFR: 136 mL/min/{1.73_m2} (ref >59)

## 2021-05-09 LAB — CBC
Hematocrit: 31.7 % — ABNORMAL LOW (ref 34.0–46.6)
Hemoglobin: 10.3 g/dL — ABNORMAL LOW (ref 11.1–15.9)
MCH: 25.9 pg — ABNORMAL LOW (ref 26.6–33.0)
MCHC: 32.5 g/dL (ref 31.5–35.7)
MCV: 80 fL (ref 79–97)
Platelets: 268 10*3/uL (ref 150–450)
RBC: 3.98 x10E6/uL (ref 3.77–5.28)
RDW: 14.9 % (ref 11.7–15.4)
WBC: 5.8 10*3/uL (ref 3.4–10.8)

## 2021-05-09 LAB — PROTEIN / CREATININE RATIO, URINE
Creatinine, Urine: 150.6 mg/dL
Protein, Ur: 17.9 mg/dL
Protein/Creat Ratio: 119 mg/g creat (ref 0–200)

## 2021-05-10 ENCOUNTER — Ambulatory Visit (INDEPENDENT_AMBULATORY_CARE_PROVIDER_SITE_OTHER): Payer: BC Managed Care – PPO

## 2021-05-10 ENCOUNTER — Other Ambulatory Visit: Payer: Self-pay

## 2021-05-10 DIAGNOSIS — R030 Elevated blood-pressure reading, without diagnosis of hypertension: Secondary | ICD-10-CM

## 2021-05-10 NOTE — Progress Notes (Signed)
Subjective:  Laurie Farmer is a 20 y.o. female here for BP check.   Hypertension ROS: Swelling in the feet, no chest pain, no shortness of breath, no visual changes, no RUQ pain.    Objective:  BP 129/64 (BP Location: Right Arm, Patient Position: Sitting, Cuff Size: Large)   Pulse (!) 108   Ht 5\' 5"  (1.651 m)   Wt 207 lb (93.9 kg)   LMP 10/29/2020   BMI 34.45 kg/m   Appearance alert, well appearing, and in no distress. General exam BP noted to be well controlled today in office.    Assessment:   Blood Pressure well controlled.   Plan:  Current treatment plan is effective, no change in therapy.10/31/2020

## 2021-05-22 ENCOUNTER — Ambulatory Visit: Payer: BC Managed Care – PPO | Admitting: *Deleted

## 2021-05-22 ENCOUNTER — Ambulatory Visit (INDEPENDENT_AMBULATORY_CARE_PROVIDER_SITE_OTHER): Payer: BC Managed Care – PPO | Admitting: Advanced Practice Midwife

## 2021-05-22 ENCOUNTER — Encounter: Payer: Self-pay | Admitting: *Deleted

## 2021-05-22 ENCOUNTER — Other Ambulatory Visit: Payer: Self-pay

## 2021-05-22 ENCOUNTER — Ambulatory Visit: Payer: BC Managed Care – PPO | Attending: Advanced Practice Midwife

## 2021-05-22 VITALS — BP 143/68 | HR 108

## 2021-05-22 VITALS — BP 127/74 | HR 105 | Wt 208.0 lb

## 2021-05-22 DIAGNOSIS — O1203 Gestational edema, third trimester: Secondary | ICD-10-CM | POA: Diagnosis present

## 2021-05-22 DIAGNOSIS — O24113 Pre-existing diabetes mellitus, type 2, in pregnancy, third trimester: Secondary | ICD-10-CM

## 2021-05-22 DIAGNOSIS — Z148 Genetic carrier of other disease: Secondary | ICD-10-CM | POA: Diagnosis not present

## 2021-05-22 DIAGNOSIS — R030 Elevated blood-pressure reading, without diagnosis of hypertension: Secondary | ICD-10-CM

## 2021-05-22 DIAGNOSIS — O26843 Uterine size-date discrepancy, third trimester: Secondary | ICD-10-CM

## 2021-05-22 DIAGNOSIS — Z3A34 34 weeks gestation of pregnancy: Secondary | ICD-10-CM | POA: Diagnosis not present

## 2021-05-22 DIAGNOSIS — E119 Type 2 diabetes mellitus without complications: Secondary | ICD-10-CM

## 2021-05-22 DIAGNOSIS — Z3400 Encounter for supervision of normal first pregnancy, unspecified trimester: Secondary | ICD-10-CM | POA: Insufficient documentation

## 2021-05-22 DIAGNOSIS — O163 Unspecified maternal hypertension, third trimester: Secondary | ICD-10-CM

## 2021-05-22 DIAGNOSIS — O99013 Anemia complicating pregnancy, third trimester: Secondary | ICD-10-CM | POA: Insufficient documentation

## 2021-05-22 NOTE — Progress Notes (Signed)
   PRENATAL VISIT NOTE  Subjective:  Laurie Farmer is a 20 y.o. G1P0 at [redacted]w[redacted]d being seen today for ongoing prenatal care.  She is currently monitored for the following issues for this low-risk pregnancy and has Supervision of normal first pregnancy, antepartum; Tinea corporis; Impaired glucose in pregnancy, antepartum; Gonorrhea affecting pregnancy in second trimester; Anemia affecting pregnancy; and Borderline systolic hypertension on their problem list.  Patient reports fatigue..  Contractions: Not present. Vag. Bleeding: None.  Movement: Present. Denies leaking of fluid.   The following portions of the patient's history were reviewed and updated as appropriate: allergies, current medications, past family history, past medical history, past social history, past surgical history and problem list.   Objective:   Vitals:   05/22/21 1128  BP: 127/74  Pulse: (!) 105  Weight: 208 lb (94.3 kg)    Fetal Status: Fetal Heart Rate (bpm): 153 Fundal Height: 35 cm Movement: Present     General:  Alert, oriented and cooperative. Patient is in no acute distress.  Skin: Skin is warm and dry. No rash noted.   Cardiovascular: Normal heart rate noted  Respiratory: Normal respiratory effort, no problems with respiration noted  Abdomen: Soft, gravid, appropriate for gestational age.  Pain/Pressure: Absent     Pelvic: Cervical exam deferred        Extremities: Normal range of motion.  Edema: Trace  Mental Status: Normal mood and affect. Normal behavior. Normal judgment and thought content.   Assessment and Plan:  Pregnancy: G1P0 at [redacted]w[redacted]d 1. [redacted] weeks gestation of pregnancy      Plan GBS/cultures next visit      Has [place] today for growth due to intermittent systolic HTN last visit iand S>D last visit (S=D today)        BP normal today  Preterm labor symptoms and general obstetric precautions including but not limited to vaginal bleeding, contractions, leaking of fluid and fetal movement were reviewed  in detail with the patient. Please refer to After Visit Summary for other counseling recommendations.   Return in about 2 weeks (around 06/05/2021) for [facility].  Future Appointments  Date Time Provider Department Center  05/22/2021  2:30 PM [facility] NURSE [facility]  05/22/2021  2:45 PM [facility] US5 [name]  06/05/2021 11:15 AM Aviva Signs, CNM [facility] None  06/12/2021 11:15 AM Aviva Signs, CNM [facility] None  06/19/2021 11:15 AM Aviva Signs, CNM [facility] None  06/26/2021 11:15 AM Aviva Signs, CNM [facility] None    Wynelle Bourgeois, CNM

## 2021-06-05 ENCOUNTER — Other Ambulatory Visit: Payer: Self-pay

## 2021-06-05 ENCOUNTER — Other Ambulatory Visit (HOSPITAL_COMMUNITY)
Admission: RE | Admit: 2021-06-05 | Discharge: 2021-06-05 | Disposition: A | Discharge status: Home / Self Care | Payer: BC Managed Care – PPO | Source: Ambulatory Visit | Attending: Advanced Practice Midwife | Admitting: Advanced Practice Midwife

## 2021-06-05 ENCOUNTER — Encounter: Payer: Self-pay | Admitting: Advanced Practice Midwife

## 2021-06-05 ENCOUNTER — Ambulatory Visit (INDEPENDENT_AMBULATORY_CARE_PROVIDER_SITE_OTHER): Payer: BC Managed Care – PPO | Admitting: Advanced Practice Midwife

## 2021-06-05 VITALS — BP 121/70 | HR 92 | Wt 213.0 lb

## 2021-06-05 DIAGNOSIS — Z3403 Encounter for supervision of normal first pregnancy, third trimester: Secondary | ICD-10-CM | POA: Diagnosis present

## 2021-06-05 DIAGNOSIS — Z3400 Encounter for supervision of normal first pregnancy, unspecified trimester: Secondary | ICD-10-CM

## 2021-06-05 DIAGNOSIS — O139 Gestational [pregnancy-induced] hypertension without significant proteinuria, unspecified trimester: Secondary | ICD-10-CM | POA: Insufficient documentation

## 2021-06-05 DIAGNOSIS — Z3A36 36 weeks gestation of pregnancy: Secondary | ICD-10-CM | POA: Diagnosis not present

## 2021-06-05 DIAGNOSIS — O133 Gestational [pregnancy-induced] hypertension without significant proteinuria, third trimester: Secondary | ICD-10-CM

## 2021-06-05 LAB — OB RESULTS CONSOLE GC/CHLAMYDIA
Chlamydia: NEGATIVE
Gonorrhea: NEGATIVE

## 2021-06-05 LAB — OB RESULTS CONSOLE RPR: RPR: NONREACTIVE

## 2021-06-05 LAB — OB RESULTS CONSOLE GBS
GBS: NEGATIVE
GBS: NEGATIVE

## 2021-06-05 LAB — OB RESULTS CONSOLE HIV ANTIBODY (ROUTINE TESTING): HIV: NONREACTIVE

## 2021-06-05 NOTE — Progress Notes (Signed)
   PRENATAL VISIT NOTE  Subjective:  Laurie Farmer is a 20 y.o. G1P0 at [redacted]w[redacted]d being seen today for ongoing prenatal care.  She is currently monitored for the following issues for this high-risk pregnancy and has Supervision of normal first pregnancy, antepartum; Tinea corporis; Impaired glucose in pregnancy, antepartum; Gonorrhea affecting pregnancy in second trimester; Anemia affecting pregnancy; Borderline systolic hypertension; Size of fetus inconsistent with dates in third trimester; and Gestational hypertension on their problem list.  Patient reports occasional contractions.  Contractions: Irritability. Vag. Bleeding: None.  Movement: Present. Denies leaking of fluid.   The following portions of the patient's history were reviewed and updated as appropriate: allergies, current medications, past family history, past medical history, past social history, past surgical history and problem list.   Objective:   Vitals:   06/05/21 1130  BP: 121/70  Pulse: 92  Weight: 213 lb (96.6 kg)    Fetal Status: Fetal Heart Rate (bpm): 155 Fundal Height: 36 cm Movement: Present  Presentation: Vertex  General:  Alert, oriented and cooperative. Patient is in no acute distress.  Skin: Skin is warm and dry. No rash noted.   Cardiovascular: Normal heart rate noted  Respiratory: Normal respiratory effort, no problems with respiration noted  Abdomen: Soft, gravid, appropriate for gestational age.  Pain/Pressure: Absent     Pelvic: Cervical exam performed in the presence of a chaperone Dilation: Closed Effacement (%): 30 Station: Ballotable  Extremities: Normal range of motion.  Edema: None  Mental Status: Normal mood and affect. Normal behavior. Normal judgment and thought content.   Assessment and Plan:  Pregnancy: G1P0 at [redacted]w[redacted]d 1. [redacted] weeks gestation of pregnancy  - GC/Chlamydia probe amp ([place])not at [facility] - Culture, beta strep (group b only) - [place] MFM OB FOLLOW UP; Future  2. Supervision  of normal first pregnancy, antepartum   3. Gestational hypertension, third trimester      Has had 4 documented systolic hypertension episodes.  Labs normal on 6/7 PCR 0.119      Denies HA or visual changes       Ordered US for growth this week       Ordered IOL for next week, cytotec and foley  Orders placed for admission       GBS and cultures done today  Preterm labor symptoms and general obstetric precautions including but not limited to vaginal bleeding, contractions, leaking of fluid and fetal movement were reviewed in detail with the patient. Please refer to After Visit Summary for other counseling recommendations.   Return in about 2 weeks (around 06/19/2021), or for PP visit BP check, for [facility].  Future Appointments  Date Time Provider Department Center  06/11/2021  1:45 PM [facility] NURSE [facility]  06/11/2021  2:00 PM [facility] US1 [facility]  06/13/2021  6:30 AM [facility] SCHED ROOM [facility] None    Wynelle Bourgeois, CNM

## 2021-06-05 NOTE — Progress Notes (Signed)
Patient was assessed and managed by nursing staff during this encounter. I have reviewed the chart and agree with the documentation and plan. I have also made any necessary editorial changes.  Wynelle Bourgeois, CNM 06/05/2021 12:04 PM

## 2021-06-06 ENCOUNTER — Other Ambulatory Visit: Payer: Self-pay | Admitting: Advanced Practice Midwife

## 2021-06-06 ENCOUNTER — Encounter (HOSPITAL_COMMUNITY): Payer: Self-pay | Admitting: *Deleted

## 2021-06-06 ENCOUNTER — Telehealth (HOSPITAL_COMMUNITY): Payer: Self-pay | Admitting: *Deleted

## 2021-06-06 LAB — GC/CHLAMYDIA PROBE AMP (~~LOC~~) NOT AT ARMC
Chlamydia: NEGATIVE
Comment: NEGATIVE
Comment: NORMAL
Neisseria Gonorrhea: NEGATIVE

## 2021-06-06 NOTE — Telephone Encounter (Signed)
Preadmission screen  

## 2021-06-09 LAB — CULTURE, BETA STREP (GROUP B ONLY): Strep Gp B Culture: NEGATIVE

## 2021-06-11 ENCOUNTER — Other Ambulatory Visit
Admission: RE | Admit: 2021-06-11 | Discharge: 2021-06-11 | Disposition: A | Discharge status: Home / Self Care | Payer: BC Managed Care – PPO | Source: Ambulatory Visit | Attending: Obstetrics and Gynecology | Admitting: Obstetrics and Gynecology

## 2021-06-11 ENCOUNTER — Encounter: Payer: Self-pay | Admitting: *Deleted

## 2021-06-11 ENCOUNTER — Ambulatory Visit (HOSPITAL_BASED_OUTPATIENT_CLINIC_OR_DEPARTMENT_OTHER): Payer: BC Managed Care – PPO

## 2021-06-11 ENCOUNTER — Ambulatory Visit: Payer: BC Managed Care – PPO | Admitting: *Deleted

## 2021-06-11 ENCOUNTER — Other Ambulatory Visit: Payer: Self-pay

## 2021-06-11 ENCOUNTER — Other Ambulatory Visit: Payer: Self-pay | Admitting: Advanced Practice Midwife

## 2021-06-11 VITALS — BP 130/74 | HR 102

## 2021-06-11 DIAGNOSIS — E119 Type 2 diabetes mellitus without complications: Secondary | ICD-10-CM

## 2021-06-11 DIAGNOSIS — O133 Gestational [pregnancy-induced] hypertension without significant proteinuria, third trimester: Secondary | ICD-10-CM

## 2021-06-11 DIAGNOSIS — Z3400 Encounter for supervision of normal first pregnancy, unspecified trimester: Secondary | ICD-10-CM

## 2021-06-11 DIAGNOSIS — R030 Elevated blood-pressure reading, without diagnosis of hypertension: Secondary | ICD-10-CM

## 2021-06-11 DIAGNOSIS — O26843 Uterine size-date discrepancy, third trimester: Secondary | ICD-10-CM

## 2021-06-11 DIAGNOSIS — O99013 Anemia complicating pregnancy, third trimester: Secondary | ICD-10-CM

## 2021-06-11 DIAGNOSIS — D649 Anemia, unspecified: Secondary | ICD-10-CM

## 2021-06-11 DIAGNOSIS — Z3A36 36 weeks gestation of pregnancy: Secondary | ICD-10-CM

## 2021-06-11 DIAGNOSIS — O24113 Pre-existing diabetes mellitus, type 2, in pregnancy, third trimester: Secondary | ICD-10-CM

## 2021-06-11 DIAGNOSIS — Z148 Genetic carrier of other disease: Secondary | ICD-10-CM

## 2021-06-11 LAB — SARS CORONAVIRUS 2 (TAT 6-24 HRS): SARS Coronavirus 2: NEGATIVE

## 2021-06-12 ENCOUNTER — Encounter: Payer: BC Managed Care – PPO | Admitting: Advanced Practice Midwife

## 2021-06-13 ENCOUNTER — Other Ambulatory Visit: Payer: Self-pay

## 2021-06-13 ENCOUNTER — Encounter (HOSPITAL_COMMUNITY): Payer: Self-pay | Admitting: Family Medicine

## 2021-06-13 ENCOUNTER — Inpatient Hospital Stay (HOSPITAL_COMMUNITY): Payer: BC Managed Care – PPO

## 2021-06-13 ENCOUNTER — Inpatient Hospital Stay (HOSPITAL_COMMUNITY)
Admission: AD | Admit: 2021-06-13 | Discharge: 2021-06-16 | DRG: 807 | Disposition: A | Discharge status: Home / Self Care | Payer: BC Managed Care – PPO | Attending: Obstetrics and Gynecology | Admitting: Obstetrics and Gynecology

## 2021-06-13 DIAGNOSIS — Z3A37 37 weeks gestation of pregnancy: Secondary | ICD-10-CM

## 2021-06-13 DIAGNOSIS — Z20822 Contact with and (suspected) exposure to covid-19: Secondary | ICD-10-CM | POA: Diagnosis present

## 2021-06-13 DIAGNOSIS — O9902 Anemia complicating childbirth: Secondary | ICD-10-CM | POA: Diagnosis present

## 2021-06-13 DIAGNOSIS — O4202 Full-term premature rupture of membranes, onset of labor within 24 hours of rupture: Secondary | ICD-10-CM | POA: Diagnosis not present

## 2021-06-13 DIAGNOSIS — O99019 Anemia complicating pregnancy, unspecified trimester: Secondary | ICD-10-CM | POA: Diagnosis present

## 2021-06-13 DIAGNOSIS — O134 Gestational [pregnancy-induced] hypertension without significant proteinuria, complicating childbirth: Secondary | ICD-10-CM | POA: Diagnosis present

## 2021-06-13 DIAGNOSIS — O139 Gestational [pregnancy-induced] hypertension without significant proteinuria, unspecified trimester: Secondary | ICD-10-CM | POA: Diagnosis present

## 2021-06-13 DIAGNOSIS — O98212 Gonorrhea complicating pregnancy, second trimester: Secondary | ICD-10-CM | POA: Diagnosis present

## 2021-06-13 DIAGNOSIS — Z3400 Encounter for supervision of normal first pregnancy, unspecified trimester: Secondary | ICD-10-CM

## 2021-06-13 LAB — TYPE AND SCREEN
ABO/RH(D): O POS
Antibody Screen: NEGATIVE

## 2021-06-13 LAB — CBC
HCT: 33.8 % — ABNORMAL LOW (ref 36.0–46.0)
Hemoglobin: 10.9 g/dL — ABNORMAL LOW (ref 12.0–15.0)
MCH: 25.6 pg — ABNORMAL LOW (ref 26.0–34.0)
MCHC: 32.2 g/dL (ref 30.0–36.0)
MCV: 79.3 fL — ABNORMAL LOW (ref 80.0–100.0)
Platelets: 287 10*3/uL (ref 150–400)
RBC: 4.26 MIL/uL (ref 3.87–5.11)
RDW: 14.8 % (ref 11.5–15.5)
WBC: 5.4 10*3/uL (ref 4.0–10.5)
nRBC: 0 % (ref 0.0–0.2)

## 2021-06-13 LAB — COMPREHENSIVE METABOLIC PANEL
ALT: 16 U/L (ref 0–44)
AST: 41 U/L (ref 15–41)
Albumin: 3 g/dL — ABNORMAL LOW (ref 3.5–5.0)
Alkaline Phosphatase: 115 U/L (ref 38–126)
Anion gap: 8 (ref 5–15)
BUN: 11 mg/dL (ref 6–20)
CO2: 21 mmol/L — ABNORMAL LOW (ref 22–32)
Calcium: 9.7 mg/dL (ref 8.9–10.3)
Chloride: 104 mmol/L (ref 98–111)
Creatinine, Ser: 0.58 mg/dL (ref 0.44–1.00)
GFR, Estimated: >60 mL/min (ref >60)
Glucose, Bld: 83 mg/dL (ref 70–99)
Potassium: 5.4 mmol/L — ABNORMAL HIGH (ref 3.5–5.1)
Sodium: 133 mmol/L — ABNORMAL LOW (ref 135–145)
Total Bilirubin: 0.8 mg/dL (ref 0.3–1.2)
Total Protein: 6.7 g/dL (ref 6.5–8.1)

## 2021-06-13 MED ORDER — LACTATED RINGERS IV SOLN: INTRAVENOUS | Status: DC

## 2021-06-13 MED ORDER — OXYCODONE-ACETAMINOPHEN 5-325 MG PO TABS: 2.0000 | ORAL_TABLET | ORAL | Status: DC | PRN

## 2021-06-13 MED ORDER — MISOPROSTOL 25 MCG QUARTER TABLET
25.0000 ug | ORAL_TABLET | ORAL | Status: DC | PRN
  Administered 2021-06-14 (×2): 25 ug via VAGINAL
  Filled 2021-06-13 (×2): qty 1

## 2021-06-13 MED ORDER — LACTATED RINGERS IV SOLN
500.0000 mL | INTRAVENOUS | Status: DC | PRN
  Administered 2021-06-14: 500 mL via INTRAVENOUS

## 2021-06-13 MED ORDER — SOD CITRATE-CITRIC ACID 500-334 MG/5ML PO SOLN: 30.0000 mL | ORAL | Status: DC | PRN

## 2021-06-13 MED ORDER — LIDOCAINE HCL (PF) 1 % IJ SOLN: 30.0000 mL | INTRAMUSCULAR | Status: DC | PRN

## 2021-06-13 MED ORDER — OXYTOCIN BOLUS FROM INFUSION
333.0000 mL | Freq: Once | INTRAVENOUS | Status: AC
  Administered 2021-06-15: 333 mL via INTRAVENOUS

## 2021-06-13 MED ORDER — ACETAMINOPHEN 325 MG PO TABS: 650.0000 mg | ORAL_TABLET | ORAL | Status: DC | PRN

## 2021-06-13 MED ORDER — ONDANSETRON HCL 4 MG/2ML IJ SOLN
4.0000 mg | Freq: Four times a day (QID) | INTRAMUSCULAR | Status: DC | PRN
  Administered 2021-06-14: 4 mg via INTRAVENOUS
  Filled 2021-06-13: qty 2

## 2021-06-13 MED ORDER — TERBUTALINE SULFATE 1 MG/ML IJ SOLN: 0.2500 mg | Freq: Once | INTRAMUSCULAR | Status: DC | PRN

## 2021-06-13 MED ORDER — FENTANYL CITRATE (PF) 100 MCG/2ML IJ SOLN: 100.0000 ug | INTRAMUSCULAR | Status: DC | PRN

## 2021-06-13 MED ORDER — OXYTOCIN-SODIUM CHLORIDE 30-0.9 UT/500ML-% IV SOLN
2.5000 [IU]/h | INTRAVENOUS | Status: DC
  Filled 2021-06-13: qty 500

## 2021-06-13 MED ORDER — OXYCODONE-ACETAMINOPHEN 5-325 MG PO TABS: 1.0000 | ORAL_TABLET | ORAL | Status: DC | PRN

## 2021-06-13 NOTE — H&P (Signed)
Laurie Farmer is a 20 y.o. G1P0 female at [redacted]w[redacted]d by 16wk u/s presenting for IOL due to gHTN.   Reports active fetal movement, contractions: none, vaginal bleeding: none, membranes: intact.  Initiated prenatal care at [facility] at 16 wks.   Most recent u/s : 36.6wks, EFW 50%, AFI 15.3cm, cephalic, ant plac.   This pregnancy complicated by: -gHTN dx at 34wks -anemia (Venofer x 2 in 3rd tri)   Prenatal History/Complications:  G1  Past Medical History: Past Medical History:  Diagnosis Date   Pregnancy induced hypertension     Past Surgical History: Past Surgical History:  Procedure Laterality Date   WISDOM TOOTH EXTRACTION  2017    Obstetrical History: OB History     Gravida  1   Para      Term      Preterm      AB      Living         SAB      IAB      Ectopic      Multiple      Live Births              Social History: Social History   Socioeconomic History   Marital status: Single    Spouse name: Not on file   Number of children: Not on file   Years of education: Not on file   Highest education level: Not on file  Occupational History   Not on file  Tobacco Use   Smoking status: Never   Smokeless tobacco: Never  Vaping Use   Vaping Use: Former   Substances: Flavoring  Substance and Sexual Activity   Alcohol use: Never   Drug use: Never   Sexual activity: Yes  Other Topics Concern   Not on file  Social History Narrative   Not on file   Social Determinants of Health   Financial Resource Strain: Not on file  Food Insecurity: Not on file  Transportation Needs: Not on file  Physical Activity: Not on file  Stress: Not on file  Social Connections: Not on file    Family History: Family History  Problem Relation Age of Onset   Hypertension Mother    Hypertension Father     Allergies: No Known Allergies  Medications Prior to Admission  Medication Sig Dispense Refill Last Dose   ASPIRIN 81 PO Take by mouth.      miconazole  (MICONAZOLE 7) 2 % vaginal cream Place 1 Applicatorful vaginally at bedtime. (Patient not taking: No sig reported) 45 g 0    Prenatal Vit-Fe Fumarate-FA (PRENATAL VITAMINS) 28-0.8 MG TABS Take 1 tablet by mouth daily. 30 tablet 11     Review of Systems  Pertinent pos/neg as indicated in HPI  Last menstrual period 10/29/2020. General appearance: alert, cooperative, and no distress Lungs: clear to auscultation bilaterally Heart: regular rate and rhythm Abdomen: gravid, soft, non-tender, EFW by Leopold's approximately 6lbs Extremities: tr edema DTR's nl  Fetal monitoring: FHR: 140s bpm, variability: moderate,  Accelerations: Present,  decelerations:  Absent Uterine activity: irreg, mild   Presentation: cephalic   Prenatal labs: ABO, Rh: O/Positive/-- (02/15 1100) Antibody: Negative (02/15 1100) Rubella: 2.38 (02/15 1100) RPR: Non Reactive (04/27 0839)  HBsAg: Negative (02/15 1100)  HIV: Non Reactive (04/27 0839)  GBS: Negative/-- (07/05 1202)  2hr GTT: 87/92/85  Prenatal Transfer Tool  Maternal Diabetes: No Genetic Screening: Normal Maternal Ultrasounds/Referrals: Normal Fetal Ultrasounds or other Referrals:  None Maternal Substance Abuse:  No  Significant Maternal Medications:  None Significant Maternal Lab Results: Group B Strep negative  No results found for this or any previous visit (from the past 24 hour(s)).   Assessment:  [redacted]w[redacted]d SIUP  G1P0  gHTN  Unfavorable cx  Cat 1 FHR  GBS Negative/-- (07/05 1202)  Plan:  Admit to L&D  IV pain meds/epidural prn active labor  Plan cytotec/cervical foley for initial ripening, followed by Pit/AROM prn  Collect pre-e labs  Anticipate vag del   Plans to breastfeed  Contraception: Depo  Circumcision: yes  [name] CNM 06/13/2021, 10:36 PM

## 2021-06-14 ENCOUNTER — Encounter (HOSPITAL_COMMUNITY): Payer: Self-pay | Admitting: *Deleted

## 2021-06-14 LAB — CBC
HCT: 33.1 % — ABNORMAL LOW (ref 36.0–46.0)
Hemoglobin: 10.5 g/dL — ABNORMAL LOW (ref 12.0–15.0)
MCH: 25.3 pg — ABNORMAL LOW (ref 26.0–34.0)
MCHC: 31.7 g/dL (ref 30.0–36.0)
MCV: 79.8 fL — ABNORMAL LOW (ref 80.0–100.0)
Platelets: 240 10*3/uL (ref 150–400)
RBC: 4.15 MIL/uL (ref 3.87–5.11)
RDW: 14.8 % (ref 11.5–15.5)
WBC: 8.3 10*3/uL (ref 4.0–10.5)
nRBC: 0 % (ref 0.0–0.2)

## 2021-06-14 LAB — RPR: RPR Ser Ql: NONREACTIVE

## 2021-06-14 LAB — PROTEIN / CREATININE RATIO, URINE
Creatinine, Urine: 109.31 mg/dL
Protein Creatinine Ratio: 0.11 mg/mg{Cre} (ref 0.00–0.15)
Total Protein, Urine: 12 mg/dL

## 2021-06-14 MED ORDER — MISOPROSTOL 50MCG HALF TABLET
50.0000 ug | ORAL_TABLET | ORAL | Status: DC | PRN
  Administered 2021-06-14 (×2): 50 ug via BUCCAL
  Filled 2021-06-14 (×2): qty 1

## 2021-06-14 MED ORDER — LACTATED RINGERS IV SOLN
500.0000 mL | Freq: Once | INTRAVENOUS | Status: AC
  Administered 2021-06-14: 500 mL via INTRAVENOUS

## 2021-06-14 MED ORDER — DIPHENHYDRAMINE HCL 50 MG/ML IJ SOLN: 12.5000 mg | INTRAMUSCULAR | Status: DC | PRN

## 2021-06-14 MED ORDER — OXYTOCIN-SODIUM CHLORIDE 30-0.9 UT/500ML-% IV SOLN
1.0000 m[IU]/min | INTRAVENOUS | Status: DC
  Administered 2021-06-14: 2 m[IU]/min via INTRAVENOUS

## 2021-06-14 MED ORDER — PHENYLEPHRINE 40 MCG/ML (10ML) SYRINGE FOR IV PUSH (FOR BLOOD PRESSURE SUPPORT): 80.0000 ug | PREFILLED_SYRINGE | INTRAVENOUS | Status: DC | PRN

## 2021-06-14 MED ORDER — FENTANYL-BUPIVACAINE-NACL 0.5-0.125-0.9 MG/250ML-% EP SOLN
12.0000 mL/h | EPIDURAL | Status: DC | PRN
  Administered 2021-06-15: 12 mL/h via EPIDURAL
  Filled 2021-06-14: qty 250

## 2021-06-14 MED ORDER — EPHEDRINE 5 MG/ML INJ: 10.0000 mg | INTRAVENOUS | Status: DC | PRN

## 2021-06-14 MED ORDER — TERBUTALINE SULFATE 1 MG/ML IJ SOLN: 0.2500 mg | Freq: Once | INTRAMUSCULAR | Status: DC | PRN

## 2021-06-14 NOTE — Progress Notes (Signed)
Patient ID: Nakiea Metzner, female   DOB: 17-Dec-2000, 20 y.o.   MRN: [id number]  Doing well; denies s/s pre-e; not feeling cramping s/p cytotec x 1 dose  BP 128/75 FHR 120-130s, +accels, no decels, Cat 1 Ctx irreg, mild Cx post tight 1/thick/vtx -2  Pre-e labs neg  IUP@37 .2wks gHTN Cx unfavorable  -Attempted cervical foley placement, and I lost my grasp of her cervix and couldn't get it stabilized again to try -Second dose of vag cytotec placed with plan for buccal in 4hrs; will try foley later on  [name] [facility] 06/14/2021

## 2021-06-14 NOTE — Progress Notes (Signed)
Labor Progress Note Laurie Farmer is a 20 y.o. G1P0 at [redacted]w[redacted]d presented for IOL for gHTN  S:  Feeling some ctx, comfortable.   O:  BP 137/66   Pulse 99   Temp 98.2 F (36.8 C) (Axillary)   Resp 18   Ht 5\' 5"  (1.651 m)   Wt 95.4 kg   LMP 10/29/2020 (Approximate)   Breastfeeding Unknown   BMI 35.01 kg/m  EFM: baseline 145 bpm/ mod variability/ no accels/ late decels  Toco/IUPC: 1-2 SVE: Dilation: 2 (balloon in place) Effacement (%): 40 Cervical Position: Posterior Station: -2 Exam by:: [name] [email or web address] Johnson, RN  A/P: 20 y.o. G1P0 [redacted]w[redacted]d  1. Labor: latent 2. FWB: Cat II 3. Pain: analgesia/anesthesia/NO prn 4. gHTN: stable  S/p Cytotec x2 and FB still in place. Maternal repositioning for late decels with resolution. Watch closely, continue with ripening.   [redacted]w[redacted]d, CNM 4:33 PM

## 2021-06-14 NOTE — Progress Notes (Signed)
Labor Progress Note Laurie Farmer is a 20 y.o. G1P0 at [redacted]w[redacted]d presented for IOL for gHTN  S:  Comfortable, no c/o.  O:  BP (!) 143/83   Pulse 86   Temp 98.4 F (36.9 C) (Oral)   Resp 18   Ht 5\' 5"  (1.651 m)   Wt 95.4 kg   LMP 10/29/2020 (Approximate)   Breastfeeding Unknown   BMI 35.01 kg/m  EFM: baseline 135 bpm/ mod variability/ + accels/ no decels  Toco/IUPC: 2-4 SVE: 1/50/-2  A/P: 20 y.o. G1P0 [redacted]w[redacted]d  1. Labor: latent 2. FWB: Cat I 3. Pain: analgesia/anesthesia/NO prn 4. gHTN: stable  Consented for FB placement. Continue Cytotec. Pitocin when appropriate. Anticipate SVD.  [redacted]w[redacted]d, CNM 9:06 AM

## 2021-06-14 NOTE — Progress Notes (Signed)
Labor Progress Note Ifeoluwa Bartz is a 20 y.o. G1P0 at [redacted]w[redacted]d presented for IOL-gHTN. S: Doing well without complaints, resting upon entry to room.  O:  BP 131/73   Pulse 84   Temp 97.7 F (36.5 C) (Oral)   Resp 16   Ht 5\' 5"  (1.651 m)   Wt 95.4 kg   LMP 10/29/2020 (Approximate)   Breastfeeding Unknown   BMI 35.01 kg/m  EFM: baseline 145bpm/mod variability/+accels/previously late decels with contractions, now recurrent deep variable decels Toco: q1-3 min  CVE: Dilation: 5.5 Effacement (%): 50 Cervical Position: Posterior Station: -3 Presentation: Vertex Exam by:: [email or web address], MD   A&P: 20 y.o. G1P0 [redacted]w[redacted]d presented for IOL-gHTN. #IOL: s/p cyto/FB out 1800. Pitocin started at 1810, currently at 90mL/hr. Unable to AROM and internalize at this time given fetal station. Will continue to monitor tracing and continue repositioning and LR bolus. #Pain: PRN #FWB: cat 2, AROM and internalize when able. Will trial position changes and LR bolus and contact Dr. 11m if no improvement. #GBS negative #gHTN: preE labs normal, asymptomatic, BP mild range. Continue to monitor.   Vergie Living, MD 9:58 PM

## 2021-06-15 ENCOUNTER — Inpatient Hospital Stay (HOSPITAL_COMMUNITY): Payer: BC Managed Care – PPO | Admitting: Anesthesiology

## 2021-06-15 ENCOUNTER — Encounter (HOSPITAL_COMMUNITY): Payer: Self-pay | Admitting: Family Medicine

## 2021-06-15 DIAGNOSIS — O134 Gestational [pregnancy-induced] hypertension without significant proteinuria, complicating childbirth: Secondary | ICD-10-CM

## 2021-06-15 DIAGNOSIS — O4202 Full-term premature rupture of membranes, onset of labor within 24 hours of rupture: Secondary | ICD-10-CM

## 2021-06-15 DIAGNOSIS — Z3A37 37 weeks gestation of pregnancy: Secondary | ICD-10-CM

## 2021-06-15 MED ORDER — TETANUS-DIPHTH-ACELL PERTUSSIS 5-2.5-18.5 LF-MCG/0.5 IM SUSY: 0.5000 mL | PREFILLED_SYRINGE | Freq: Once | INTRAMUSCULAR | Status: DC

## 2021-06-15 MED ORDER — PRENATAL MULTIVITAMIN CH
1.0000 | ORAL_TABLET | Freq: Every day | ORAL | Status: DC
  Administered 2021-06-15 – 2021-06-16 (×2): 1 via ORAL
  Filled 2021-06-15 (×2): qty 1

## 2021-06-15 MED ORDER — COCONUT OIL OIL: 1.0000 "application " | TOPICAL_OIL | Status: DC | PRN

## 2021-06-15 MED ORDER — BENZOCAINE-MENTHOL 20-0.5 % EX AERO
1.0000 "application " | INHALATION_SPRAY | CUTANEOUS | Status: DC | PRN
  Administered 2021-06-15: 1 via TOPICAL
  Filled 2021-06-15: qty 56

## 2021-06-15 MED ORDER — MEDROXYPROGESTERONE ACETATE 150 MG/ML IM SUSP: 150.0000 mg | INTRAMUSCULAR | Status: DC | PRN

## 2021-06-15 MED ORDER — ACETAMINOPHEN 325 MG PO TABS: 650.0000 mg | ORAL_TABLET | ORAL | Status: DC | PRN

## 2021-06-15 MED ORDER — ONDANSETRON HCL 4 MG/2ML IJ SOLN: 4.0000 mg | INTRAMUSCULAR | Status: DC | PRN

## 2021-06-15 MED ORDER — MEASLES, MUMPS & RUBELLA VAC IJ SOLR: 0.5000 mL | Freq: Once | INTRAMUSCULAR | Status: DC

## 2021-06-15 MED ORDER — IBUPROFEN 600 MG PO TABS
600.0000 mg | ORAL_TABLET | Freq: Four times a day (QID) | ORAL | Status: DC
  Administered 2021-06-15 – 2021-06-16 (×5): 600 mg via ORAL
  Filled 2021-06-15 (×5): qty 1

## 2021-06-15 MED ORDER — ONDANSETRON HCL 4 MG PO TABS: 4.0000 mg | ORAL_TABLET | ORAL | Status: DC | PRN

## 2021-06-15 MED ORDER — DIPHENHYDRAMINE HCL 25 MG PO CAPS
25.0000 mg | ORAL_CAPSULE | Freq: Four times a day (QID) | ORAL | Status: DC | PRN
Start: 2021-06-15 — End: 2021-06-16

## 2021-06-15 MED ORDER — LIDOCAINE HCL (PF) 1 % IJ SOLN
INTRAMUSCULAR | Status: DC | PRN
  Administered 2021-06-15: 11 mL via EPIDURAL

## 2021-06-15 MED ORDER — DIBUCAINE (PERIANAL) 1 % EX OINT
1.0000 | TOPICAL_OINTMENT | CUTANEOUS | Status: DC | PRN
Start: 2021-06-15 — End: 2021-06-16

## 2021-06-15 MED ORDER — WITCH HAZEL-GLYCERIN EX PADS
1.0000 | MEDICATED_PAD | CUTANEOUS | Status: DC | PRN
Start: 2021-06-15 — End: 2021-06-16

## 2021-06-15 MED ORDER — SENNOSIDES-DOCUSATE SODIUM 8.6-50 MG PO TABS
2.0000 | ORAL_TABLET | ORAL | Status: DC
  Administered 2021-06-15 – 2021-06-16 (×2): 2 via ORAL
  Filled 2021-06-15 (×2): qty 2

## 2021-06-15 MED ORDER — SIMETHICONE 80 MG PO CHEW
80.0000 mg | CHEWABLE_TABLET | ORAL | Status: DC | PRN
Start: 2021-06-15 — End: 2021-06-16

## 2021-06-15 NOTE — Anesthesia Procedure Notes (Signed)
Epidural Patient location during procedure: OB Start time: 06/15/2021 12:16 AM End time: 06/15/2021 12:35 AM  Staffing Anesthesiologist: Lowella Curb, MD Performed: anesthesiologist   Preanesthetic Checklist Completed: patient identified, IV checked, site marked, risks and benefits discussed, surgical consent, monitors and equipment checked, pre-op evaluation and timeout performed  Epidural Patient position: sitting Prep: ChloraPrep Patient monitoring: heart rate, cardiac monitor, continuous pulse ox and blood pressure Approach: midline Injection technique: LOR air  Needle:  Needle type: Tuohy  Needle gauge: 17 G Needle length: 9 cm Needle insertion depth: 7 cm Catheter type: closed end flexible Catheter size: 20 Guage Catheter at skin depth: 11 cm Test dose: negative  Assessment Events: blood not aspirated, injection not painful, no injection resistance, no paresthesia and negative IV test  Additional Notes Reason for block:procedure for pain

## 2021-06-15 NOTE — Lactation Note (Signed)
This note was copied from a baby's chart. Lactation Consultation Note  Patient Name: Laurie Farmer ATFTD'D Date: 06/15/2021 Reason for consult: Initial assessment Age:20 Hours   Mother is a P1, infant is an Early Term infant . Mother was given ETI handout guidelines .  Infant has not latched on since birth. Infant has not showed any feeding cues. Mother has been doing STS .  Mother was given [facility] brochure and basic teaching done.  Mother taught to do hand expression. Observed tiny drops of colostrum, Mother has very swollen breast tissue. She doesn't have a bra with her.  Her nipples are flat but the RT breast is compressible.  Gave the harmony hand pump and used to get nipple out with pre pumping. Before attempting to latch.  Infant may could get latched if he were showing feeding cues. He doesn't open his mouth. Attempt to trigger suck reflex with gloved finger . Infant refused to suck on gloved finger.   DEBP sat up at the bedside.assist mother with pumping using a #24 flange. Assist mother with pumping. Mother obtained ebm that was fed to infant with a spoon.   Plan of Care : Breastfeed infant with feeding cues Supplement infant with ebm Pump using a DEBP after each feeding for 15-20 mins.    Mother is active with [facility]IC and discussed a [facility] referral being signed to secure a  pump. Mother reports she plans to  phone [facility] on Monday. Mother to continue to cue base feed infant and feed at least 8-12 times or more in 24 hours and advised to allow for cluster feeding infant as needed.  Mother to continue to due STS. Mother is aware of available LC services at [facility], BFSG'S, OP Dept, and phone # for questions or concerns about breastfeeding.  Mother receptive to all teaching and plan of care.            Mother to continue to cue base feed infant and feed at least 8-12 times or more in 24 hours and advised to allow for cluster feeding infant as needed.  Mother to continue to due STS.  Mother is aware of available LC services at [facility], BFSG'S, OP Dept, and phone # for questions or concerns about breastfeeding.  Mother receptive to all teaching and plan of care.    Maternal Data Has patient been taught Hand Expression?: Yes Does the patient have breastfeeding experience prior to this delivery?: No  Feeding Mother's Current Feeding Choice: Breast Milk  LATCH Score                    Lactation Tools Discussed/Used Tools: Flanges;Pump;Shells Flange Size: 24 Breast pump type: Double-Electric Breast Pump;Manual Pump Education: Setup, frequency, and cleaning;Milk Storage Reason for Pumping: lactation induction Pumping frequency: q 3 hours Pumped volume: 0.5 mL  Interventions Interventions: Breast feeding basics reviewed;Assisted with latch;Skin to skin;Hand express;Pre-pump if needed;Adjust position;Hand pump;DEBP;Shells  Discharge    Consult Status Consult Status: Follow-up Date: 06/15/21 Follow-up type: In-patient    Stevan Born [facility] 06/15/2021, 3:35 PM

## 2021-06-15 NOTE — Progress Notes (Signed)
Called to discuss FHT, cat 2 strip with Dr. Vergie Living, given patient has made minimal cervical change through the night. Decision made to decrease pitocin from 69mL/hr to 27mL/hr and hold given that FHT was overall cat 1 and reassuring when patient on 53mL/hr. Will continue to monitor and recheck cervix in 4-6 hours.   Alric Seton, MD OB Fellow, Faculty [facility], [facility] 06/15/2021 3:53 AM

## 2021-06-15 NOTE — Anesthesia Postprocedure Evaluation (Signed)
Anesthesia Post Note  Patient: Laurie Farmer  Procedure(s) Performed: AN AD HOC LABOR EPIDURAL     Patient location during evaluation: Mother Baby Anesthesia Type: Epidural Level of consciousness: awake and alert Pain management: pain level controlled Vital Signs Assessment: post-procedure vital signs reviewed and stable Respiratory status: spontaneous breathing, nonlabored ventilation and respiratory function stable Cardiovascular status: stable Postop Assessment: no headache, no backache and epidural receding Anesthetic complications: no   No notable events documented.  Last Vitals:  Vitals:   06/15/21 0800 06/15/21 0815  BP: 132/76 129/71  Pulse: (!) 101 (!) 103  Resp: 16   Temp:    SpO2:      Last Pain:  Vitals:   06/15/21 0815  TempSrc:   PainSc: 0-No pain   Pain Goal:                   [name]

## 2021-06-15 NOTE — Lactation Note (Signed)
This note was copied from a baby's chart. Lactation Consultation Note  Patient Name: Laurie Farmer YWVPX'T Date: 06/15/2021 Reason for consult: L&D Initial assessment Age:20 hours  P1, [redacted]w[redacted]d. Mother states to [facility] upon entering that she wants to do both - clarified breastfeed and formula feed. Encouraged offering the breast before formula to help establish milk supply. Hand expressed a few drops prior to latch attempt. Assisted with latching.  Baby did not open wide to latch.   Placed baby STS on mother's chest. Discussed feeding with cues.  Lactation to follow up on MBU.   Maternal Data Does the patient have breastfeeding experience prior to this delivery?: No  Feeding Mother's Current Feeding Choice: Breast Milk and Formula Interventions Interventions: Assisted with latch;Skin to skin;Hand express;Education    Consult Status Consult Status: Follow-up Date: 06/15/21 Follow-up type: In-patient    Dahlia Byes [facility] 06/15/2021, 8:11 AM

## 2021-06-15 NOTE — Anesthesia Preprocedure Evaluation (Signed)
Anesthesia Evaluation  Patient identified by MRN, date of birth, ID band Patient awake    Reviewed: Allergy & Precautions, NPO status , Patient's Chart, lab work & pertinent test results  Airway Mallampati: II  TM Distance: >3 FB Neck ROM: Full    Dental no notable dental hx.    Pulmonary neg pulmonary ROS,    Pulmonary exam normal breath sounds clear to auscultation       Cardiovascular hypertension, negative cardio ROS Normal cardiovascular exam Rhythm:Regular Rate:Normal     Neuro/Psych negative neurological ROS  negative psych ROS   GI/Hepatic negative GI ROS, Neg liver ROS,   Endo/Other  negative endocrine ROS  Renal/GU negative Renal ROS  negative genitourinary   Musculoskeletal negative musculoskeletal ROS (+)   Abdominal   Peds negative pediatric ROS (+)  Hematology negative hematology ROS (+)   Anesthesia Other Findings   Reproductive/Obstetrics negative OB ROS (+) Pregnancy                             Anesthesia Physical Anesthesia Plan  ASA: 2  Anesthesia Plan: Epidural   Post-op Pain Management:    Induction:   PONV Risk Score and Plan:   Airway Management Planned:   Additional Equipment:   Intra-op Plan:   Post-operative Plan:   Informed Consent:   Plan Discussed with:   Anesthesia Plan Comments:         Anesthesia Quick Evaluation

## 2021-06-15 NOTE — Discharge Summary (Signed)
Postpartum Discharge Summary  Date of Service updated 06/16/21     Patient Name: Laurie Farmer DOB: [birth year] MRN: [id number]  Date of admission: 06/13/2021 Delivery date:06/15/2021  Delivering provider: Arrie Senate  Date of discharge: 06/16/2021  Admitting diagnosis: Gestational hypertension [O13.9] Intrauterine pregnancy: [redacted]w[redacted]d    Secondary diagnosis:  Active Problems:   Supervision of normal first pregnancy, antepartum   Gonorrhea affecting pregnancy in second trimester   Anemia affecting pregnancy   Gestational hypertension   Vaginal delivery  Additional problems: none    Discharge diagnosis: Term Pregnancy Delivered, Gestational Hypertension, and Anemia                                              Post partum procedures: depo Augmentation: Pitocin, Cytotec, and IP Foley Complications: None  Hospital course: Induction of Labor With Vaginal Delivery   20y.o. yo G1P0 at 377w3das admitted to the hospital 06/13/2021 for induction of labor.  Indication for induction: Gestational hypertension.  Patient had an uncomplicated labor course as follows: Membrane Rupture Time/Date: 12:47 AM ,06/15/2021   Delivery Method:Vaginal, Spontaneous  Episiotomy: None  Lacerations:  1st degree  Details of delivery can be found in separate delivery note.  Patient had a routine postpartum course. Patient is discharged home 06/16/21.  Newborn Data: Birth date:06/15/2021  Birth time:7:07 AM  Gender:Female  Living status:Living  Apgars:8 ,9  Weight:3025 g   Magnesium Sulfate received: No BMZ received: No Rhophylac:N/A MMR:N/A T-DaP:Given prenatally Flu: Yes Transfusion:No  Physical exam  Vitals:   06/15/21 1345 06/15/21 1809 06/15/21 2343 06/16/21 0542  BP: 115/68 134/78 116/71 103/66  Pulse: 99 99 (!) 102 98  Resp: 18 17 18 18   Temp: 99 F (37.2 C) 98.9 F (37.2 C) 98 F (36.7 C) 98 F (36.7 C)  TempSrc: Axillary Axillary Oral Oral  SpO2:      Weight:      Height:        General: alert, cooperative, and no distress Lochia: appropriate Uterine Fundus: firm Incision: N/A DVT Evaluation: No evidence of DVT seen on physical exam. Labs: Lab Results  Component Value Date   WBC 8.3 06/14/2021   HGB 10.5 (L) 06/14/2021   HCT 33.1 (L) 06/14/2021   MCV 79.8 (L) 06/14/2021   PLT 240 06/14/2021   CMP Latest Ref Rng & Units 06/13/2021  Glucose 70 - 99 mg/dL 83  BUN 6 - 20 mg/dL 11  Creatinine 0.44 - 1.00 mg/dL 0.58  Sodium 135 - 145 mmol/L 133(L)  Potassium 3.5 - 5.1 mmol/L 5.4(H)  Chloride 98 - 111 mmol/L 104  CO2 22 - 32 mmol/L 21(L)  Calcium 8.9 - 10.3 mg/dL 9.7  Total Protein 6.5 - 8.1 g/dL 6.7  Total Bilirubin 0.3 - 1.2 mg/dL 0.8  Alkaline Phos 38 - 126 U/L 115  AST 15 - 41 U/L 41  ALT 0 - 44 U/L 16   Edinburgh Score: Edinburgh Postnatal Depression Scale Screening Tool 06/15/2021  I have been able to laugh and see the funny side of things. 0  I have looked forward with enjoyment to things. 0  I have blamed myself unnecessarily when things went wrong. 0  I have been anxious or worried for no good reason. 0  I have felt scared or panicky for no good reason. 0  Things have been getting on top  of me. 0  I have been so unhappy that I have had difficulty sleeping. 0  I have felt sad or miserable. 0  I have been so unhappy that I have been crying. 0  The thought of harming myself has occurred to me. 0  Edinburgh Postnatal Depression Scale Total 0     After visit meds:  Allergies as of 06/16/2021   No Known Allergies      Medication List     STOP taking these medications    ASPIRIN 81 PO       TAKE these medications    acetaminophen 500 MG tablet Commonly known as: TYLENOL Take 2 tablets (1,000 mg total) by mouth every 6 (six) hours as needed (for pain scale < 4).   coconut oil Oil Apply 1 application topically as needed.   ibuprofen 600 MG tablet Commonly known as: ADVIL Take 1 tablet (600 mg total) by mouth every 6 (six)  hours as needed.   Prenatal Vitamins 28-0.8 MG Tabs Take 1 tablet by mouth daily.         Discharge home in stable condition Infant Feeding: Breast Infant Disposition:home with mother Discharge instruction: per After Visit Summary and Postpartum booklet. Activity: Advance as tolerated. Pelvic rest for 6 weeks.  Diet: routine diet Future Appointments: Future Appointments  Date Time Provider [place]  06/19/2021  1:30 PM [facility] NURSE [facility] None  07/24/2021 10:15 AM Seabron Spates, CNM [facility] None   Follow up Visit: Message sent to [facility] 06/15/21 by Sylvester Harder.   Please schedule this patient for a In person postpartum visit in 6 weeks with the following provider: Any provider. Additional Postpartum F/U:BP check 1 week  High risk pregnancy complicated by: HTN Delivery mode:  Vaginal, Spontaneous  Anticipated Birth Control:  PP Depo given   01/15/864 Arrie Senate, MD

## 2021-06-15 NOTE — Progress Notes (Addendum)
Labor Progress Note Laurie Farmer is a 20 y.o. G1P0 at [redacted]w[redacted]d presented for IOL-gHTN. S: Doing well without complaints, sleeping upon entry.  O:  BP 119/71 (BP Location: Right Arm)   Pulse 95   Temp 97.8 F (36.6 C) (Axillary)   Resp 16   Ht 5\' 5"  (1.651 m)   Wt 95.4 kg   LMP 10/29/2020 (Approximate)   SpO2 99%   Breastfeeding Unknown   BMI 35.01 kg/m  EFM: baseline 145bpm/mod variability/+accels/intermittent variable and late decel, improved from prior Toco: q1-3 min  CVE: Dilation: 5.5 Effacement (%): 70 Cervical Position: Posterior Station: -2 Presentation: Vertex Exam by:: [name] [email or web address] Johnson, RN   A&P: 20 y.o. G1P0 [redacted]w[redacted]d presented for IOL-gHTN. #IOL: s/p cyto/FB out 1800. Pitocin started at 1810, currently at 47mL/hr. SROM @0045  with clear fluid. Given patient having intermittent periods of late/variable decels IUPC/FSE placed without difficulty, will continue to monitor. Consider amnio if variables persist. #Pain: epidural #FWB: cat 2, FHT improved from prior after LR bolus and position changes #GBS negative #gHTN: preE labs normal, asymptomatic, BP mild range. Continue to monitor.   4m, MD 3:02 AM

## 2021-06-16 MED ORDER — COCONUT OIL OIL: 1.0000 "application " | TOPICAL_OIL | 0 refills | Status: DC | PRN

## 2021-06-16 MED ORDER — IBUPROFEN 600 MG PO TABS: 600.0000 mg | ORAL_TABLET | Freq: Four times a day (QID) | ORAL | 0 refills | Status: DC | PRN

## 2021-06-16 MED ORDER — ACETAMINOPHEN 500 MG PO TABS: 1000.0000 mg | ORAL_TABLET | Freq: Four times a day (QID) | ORAL | Status: DC | PRN

## 2021-06-18 LAB — SURGICAL PATHOLOGY

## 2021-06-19 ENCOUNTER — Encounter: Payer: BC Managed Care – PPO | Admitting: Advanced Practice Midwife

## 2021-06-19 ENCOUNTER — Ambulatory Visit: Payer: BC Managed Care – PPO

## 2021-06-20 ENCOUNTER — Ambulatory Visit (INDEPENDENT_AMBULATORY_CARE_PROVIDER_SITE_OTHER): Payer: BC Managed Care – PPO

## 2021-06-20 ENCOUNTER — Other Ambulatory Visit: Payer: Self-pay

## 2021-06-20 VITALS — BP 127/82 | HR 74 | Wt 194.0 lb

## 2021-06-20 DIAGNOSIS — Z0130 Encounter for examination of blood pressure without abnormal findings: Secondary | ICD-10-CM

## 2021-06-20 NOTE — Progress Notes (Signed)
Subjective:  Laurie Farmer is a 20 y.o. female here for BP check.   Hypertension ROS: taking medications as instructed, no medication side effects noted, no TIA's, no chest pain on exertion, no dyspnea on exertion, and no swelling of ankles.    Objective:  There were no vitals taken for this visit.  Appearance alert, well appearing, and in no distress. General exam BP noted to be well controlled today in office.    Assessment:   Blood Pressure well controlled.   Plan:  Current treatment plan is effective, no change in therapy.Pt will f/u at [facility] visit.

## 2021-06-21 NOTE — Progress Notes (Signed)
Patient was assessed and managed by nursing staff during this encounter. I have reviewed the chart and agree with the documentation and plan.   Jaynie Collins, MD 06/21/2021 12:24 PM

## 2021-06-26 ENCOUNTER — Encounter: Payer: BC Managed Care – PPO | Admitting: Advanced Practice Midwife

## 2021-06-27 ENCOUNTER — Telehealth (HOSPITAL_COMMUNITY): Payer: Self-pay

## 2021-06-27 NOTE — Telephone Encounter (Signed)
"  I'm doing good." Patient declines any questions or concerns about herself as she is healing.  "He is doing good. We have been to the pediatrician and he is feeding good. He sleeps in his crib." Patient has no questions or concerns about baby.  EPDS score is 0.  Marcelino Duster [facility] 06/27/2021,1801

## 2021-07-12 IMAGING — US US MFM OB FOLLOW-UP
1 series · 14 of 22 positions shown · non-contrast
Comparison: none

[Series 1: us mfm ob follow-up · 22 acquisitions, 14 frames shown]
[im 1/22]
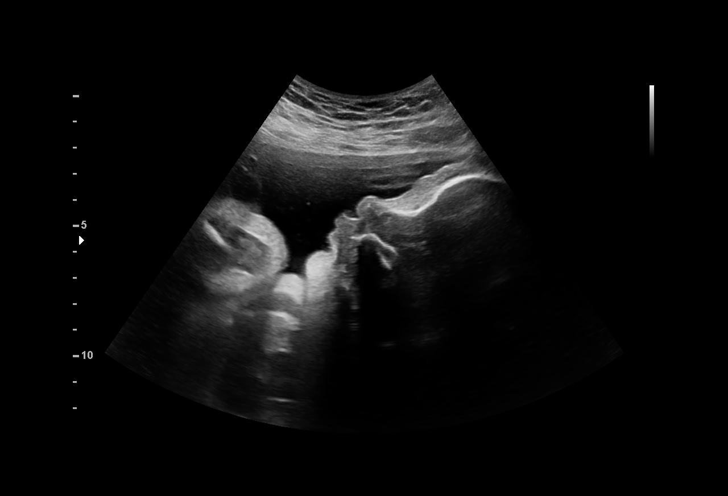
[im 3/22]
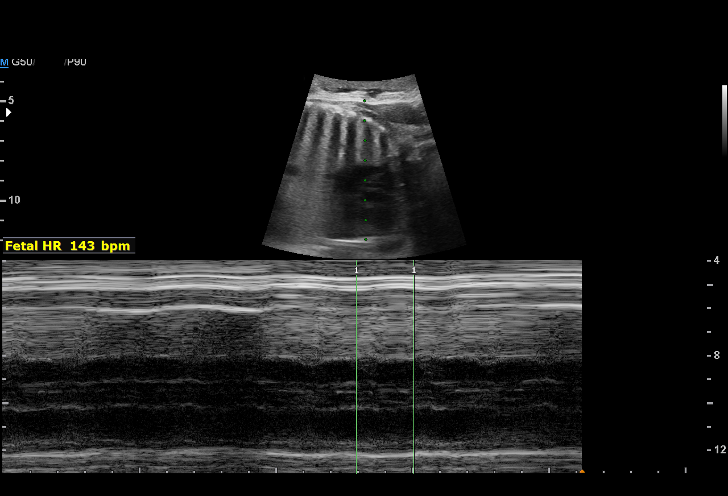
[im 4/22]
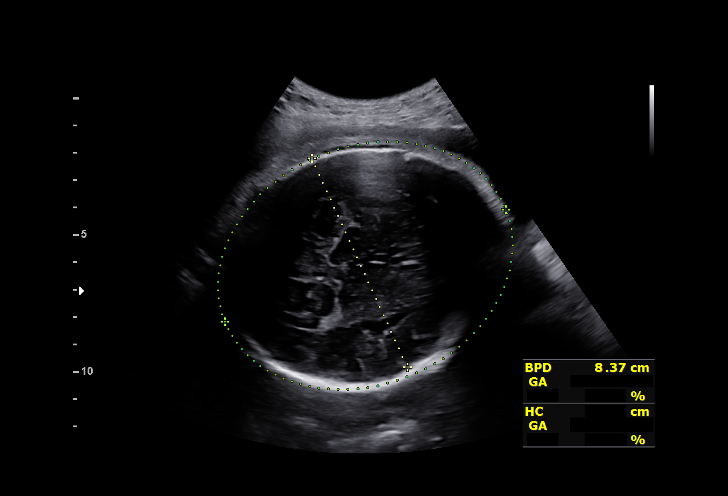
[im 6/22]
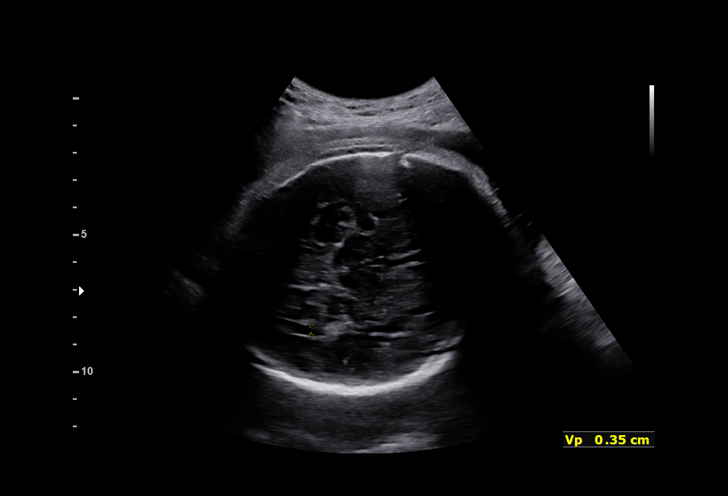
[im 8/22]
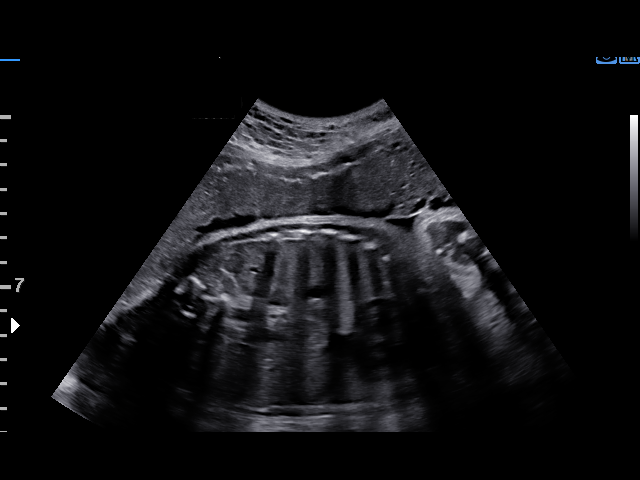
[im 9/22]
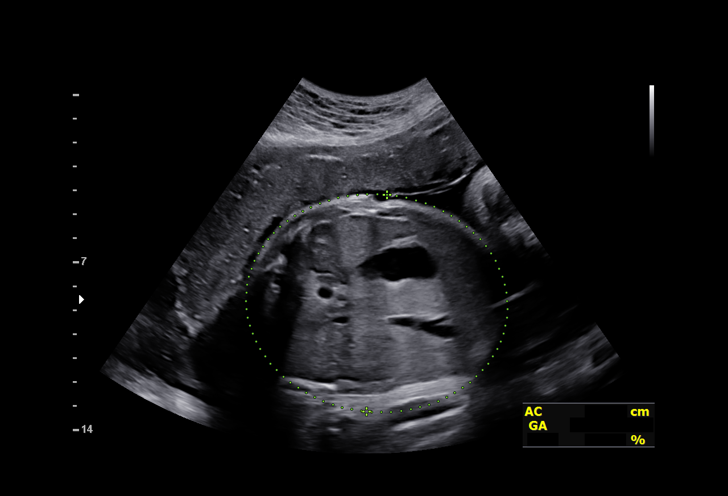
[im 11/22]
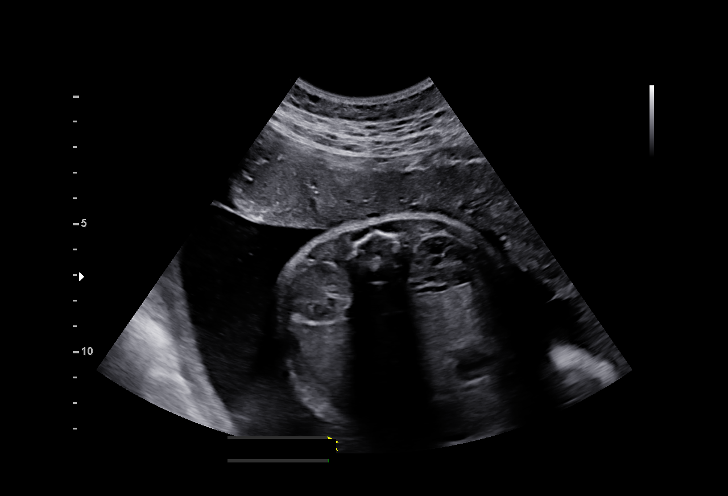
[im 12/22]
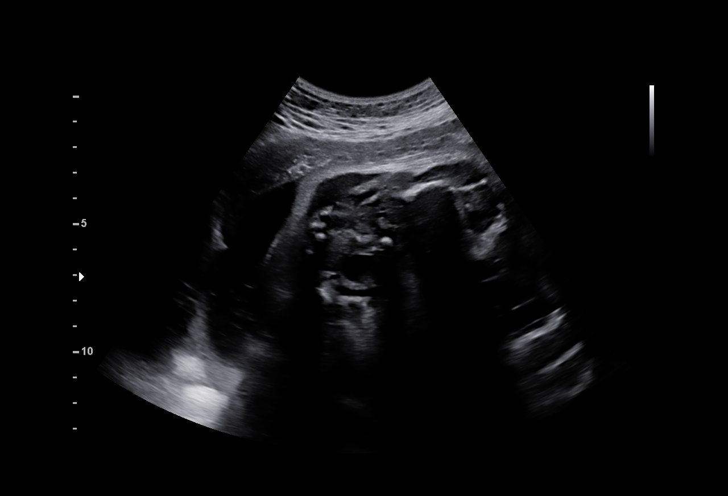
[im 14/22]
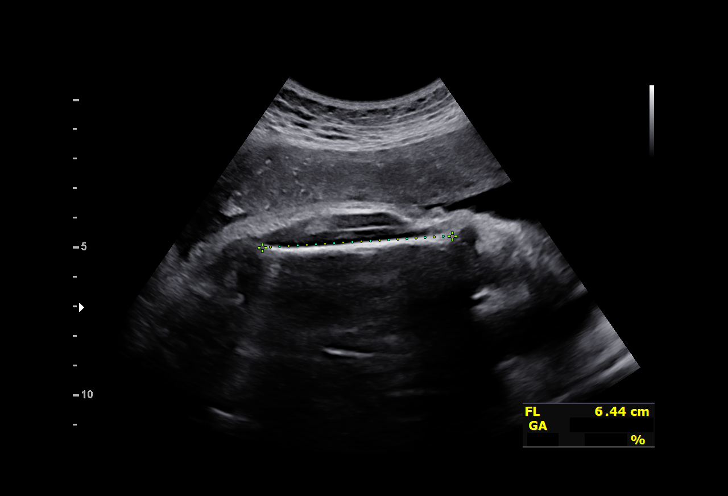
[im 15/22]
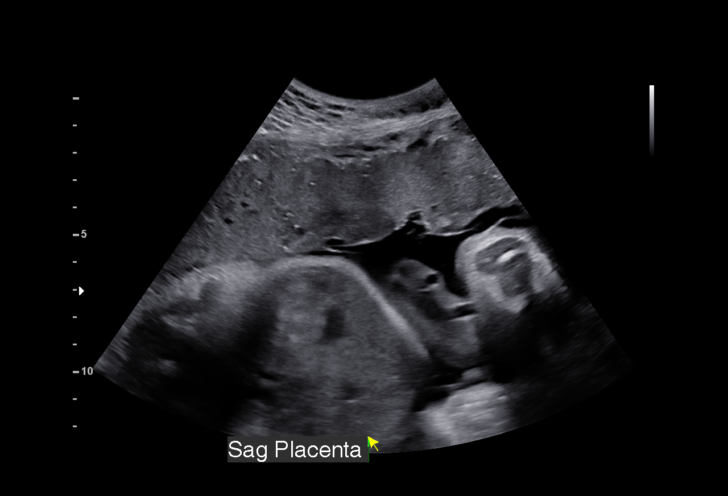
[im 17/22]
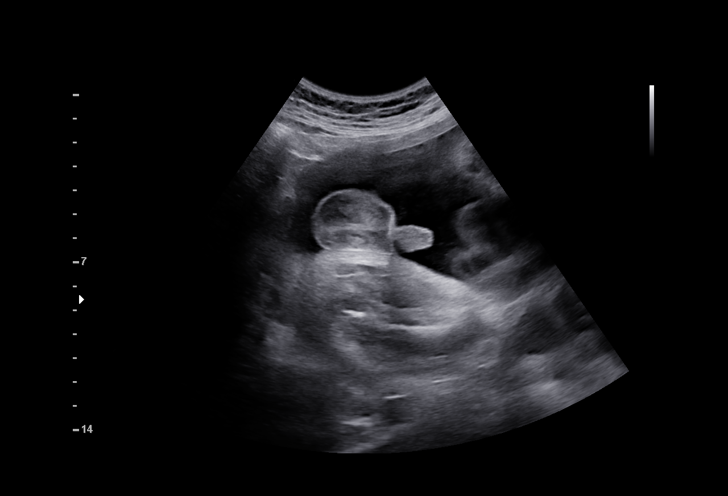
[im 19/22]
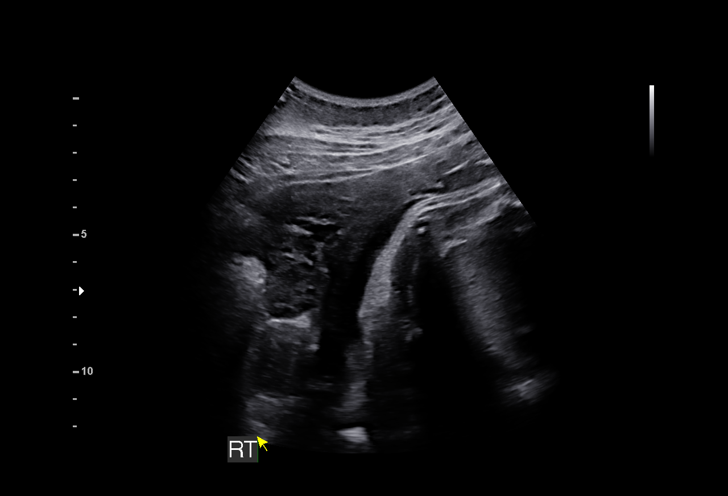
[im 20/22]
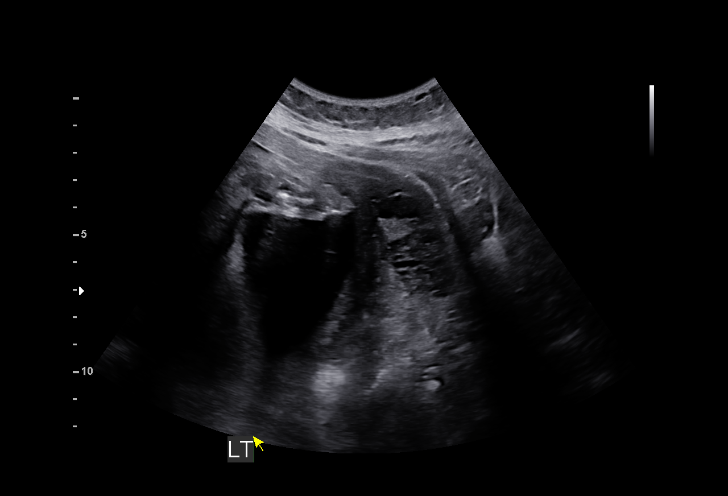
[im 22/22]
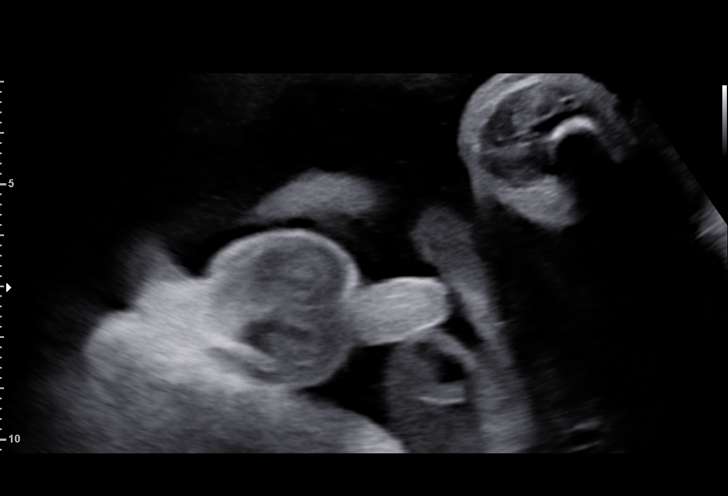

[14 of 22 positions shown; findings below may reference images not displayed]

[REDACTED]
                   JHEMBOY CNM

Indications

 "possible" Pre-existing diabetes, type 2, in
 pregnancy, second trimester
 34 weeks gestation of pregnancy
 Genetic carrier (Rowell, Salvatore)
Fetal Evaluation

 Num Of Fetuses:         1
 Fetal Heart Rate(bpm):  143
 Cardiac Activity:       Observed
 Presentation:           Cephalic
 Placenta:               Anterior
 P. Cord Insertion:      Previously Visualized

 Amniotic Fluid
 AFI FV:      Within normal limits

 AFI Sum(cm)     %Tile       Largest Pocket(cm)
 23.09           88

 RUQ(cm)       RLQ(cm)       LUQ(cm)        LLQ(cm)

Biometry

 BPD:      83.4  mm     G. Age:  33w 4d         34  %    CI:        72.92   %    70 - 86
                                                         FL/HC:      20.7   %    19.4 -
 HC:      310.5  mm     G. Age:  34w 5d         31  %    HC/AC:      1.00        0.96 -
 AC:      310.1  mm     G. Age:  35w 0d         80  %    FL/BPD:     77.1   %    71 - 87
 FL:       64.3  mm     G. Age:  33w 1d         20  %    FL/AC:      20.7   %    20 - 24

 LV:        3.5  mm
 Est. FW:    3677  gm      5 lb 5 oz     53  %
OB History

 Gravidity:    1
Gestational Age

 LMP:           29w 2d        Date:  10/29/20                 EDD:   08/05/21
 Clinical EDD:  34w 0d                                        EDD:   07/03/21
 U/S Today:     34w 1d                                        EDD:   07/02/21
 Best:          34w 0d     Det. By:  Clinical EDD             EDD:   07/03/21
Anatomy

 Cranium:               Appears normal         LVOT:                   Previously seen
 Cavum:                 Previously seen        Aortic Arch:            Previously seen
 Ventricles:            Appears normal         Ductal Arch:            Previously seen
 Choroid Plexus:        Previously seen        Diaphragm:              Previously seen
 Cerebellum:            Previously seen        Stomach:                Appears normal, left
                                                                       sided
 Posterior Fossa:       Previously seen        Abdomen:                Previously seen
 Nuchal Fold:           Previously seen        Abdominal Wall:         Previously seen
 Face:                  Orbits and profile     Cord Vessels:           Previously seen
                        previously seen
 Lips:                  Previously seen        Kidneys:                Appear normal
 Palate:                Previously seen        Bladder:                Appears normal
 Thoracic:              Previously seen        Spine:                  Previously seen
 Heart:                 Appears normal         Upper Extremities:      Previously seen
                        (4CH, axis, and
                        situs)
 RVOT:                  Previously seen        Lower Extremities:      Previously seen

 Other:  VC, 3VV and 3VTV previously visualized. Heels and 5th digit
         previously visualized. Male gender previously seen.
Cervix Uterus Adnexa

 Cervix
 Not visualized (advanced GA >33wks)

 Right Ovary
 Visualized.

 Left Ovary
 Visualized.
Comments

 This patient was seen for a follow up growth scan.  The
 patient reports that she has screened negative for gestational
 diabetes in her current pregnancy.  She denies any problems
 since her last exam.
 She was informed that the fetal growth and amniotic fluid
 level appears appropriate for her gestational age.
 As the fetal growth is within normal limits, no further exams
 were scheduled in our office.

## 2021-07-24 ENCOUNTER — Ambulatory Visit (INDEPENDENT_AMBULATORY_CARE_PROVIDER_SITE_OTHER): Payer: BC Managed Care – PPO | Admitting: Advanced Practice Midwife

## 2021-07-24 ENCOUNTER — Other Ambulatory Visit: Payer: Self-pay

## 2021-07-24 ENCOUNTER — Encounter: Payer: Self-pay | Admitting: Advanced Practice Midwife

## 2021-07-24 DIAGNOSIS — Z30013 Encounter for initial prescription of injectable contraceptive: Secondary | ICD-10-CM | POA: Diagnosis not present

## 2021-07-24 DIAGNOSIS — Z3042 Encounter for surveillance of injectable contraceptive: Secondary | ICD-10-CM

## 2021-07-24 LAB — POCT URINE PREGNANCY: Preg Test, Ur: NEGATIVE

## 2021-07-24 MED ORDER — MEDROXYPROGESTERONE ACETATE 150 MG/ML IM SUSP
150.0000 mg | Freq: Once | INTRAMUSCULAR | Status: AC
  Administered 2021-07-24: 150 mg via INTRAMUSCULAR

## 2021-07-24 NOTE — Progress Notes (Addendum)
Post Partum Visit Note  Laurie Farmer is a 20 y.o. G8P1001 female who presents for a postpartum visit. She is 5 weeks postpartum following a normal spontaneous vaginal delivery.  I have fully reviewed the prenatal and intrapartum course. The delivery was at 37 gestational weeks.  Anesthesia: epidural. Postpartum course has been uneventful. Baby is doing well. Baby is feeding by bottle - [employer] Start Soothe Pro . Bleeding no bleeding. Bowel function is normal. Bladder function is normal. Patient is not sexually active. Contraception method is Depo-Provera injections. Postpartum depression screening: negative.   The pregnancy intention screening data noted above was reviewed. Potential methods of contraception were discussed. The patient elected to proceed with No data recorded.    Health Maintenance Due  Topic Date Due   COVID-19 Vaccine (1) Never done   Pneumococcal Vaccine 63-20 Years old (1 - PCV) Never done   URINE MICROALBUMIN  Never done   HPV VACCINES (1 - 2-dose series) Never done   INFLUENZA VACCINE  07/02/2021    The following portions of the patient's history were reviewed and updated as appropriate: allergies, current medications, past family history, past medical history, past social history, past surgical history, and problem list.  Review of Systems Pertinent items noted in HPI and remainder of comprehensive ROS otherwise negative.  Objective:  There were no vitals taken for this visit.   General:  alert, cooperative, and no distress   Breasts:  normal  Lungs: No problems with respirations  Heart:  Normal rate  Abdomen: soft, non-tender; bowel sounds normal; no masses,  no organomegaly   Wound States perineum healed, has not had intercourse yet  GU exam:  not indicated       Assessment:    1. Postpartum care and examination     Reviewed options for contraception     Wants to use DepoProvera but worried about weight gain         Discussed this is  mostly an effect on eppetite, discussed low carb diet and exercise   Normal  postpartum exam.   UPT is NEGATIVE  Plan:   Essential components of care per ACOG recommendations:  1.  Mood and well being: Patient with negative depression screening today. Reviewed local resources for support.  - Patient tobacco use? No.   - hx of drug use? No.    2. Infant care and feeding:  -Patient currently breastmilk feeding? No.  -Social determinants of health (SDOH) reviewed in EPIC. No concerns  3. Sexuality, contraception and birth spacing - Patient does not want a pregnancy in the next year.  Desired family size is undecided children.  - Reviewed forms of contraception in tiered fashion. Patient desired Depo-Provera today.   - Discussed birth spacing of 18 months  4. Sleep and fatigue -Encouraged family/partner/community support of 4 hrs of uninterrupted sleep to help with mood and fatigue  5. Physical Recovery  - Discussed patients delivery and complications. She describes her labor as good. - Patient had a Vaginal, no problems at delivery. Patient had a 1st degree laceration. Perineal healing reviewed. Patient expressed understanding - Patient has urinary incontinence? No. - Patient is safe to resume physical and sexual activity  6.  Health Maintenance - HM due items addressed Yes - Last pap smear No results found for: DIAGPAP Pap smear not done at today's visit.  -Breast Cancer screening indicated? No.   7. Chronic Disease/Pregnancy Condition follow up: Anemia  Hemoglobin 10.5 at discharge, no action necessary.  Had a history of being told by peds that her blood sugar was elevated at age 57 but no formal testing was ever done.  Hemoglobin A1C at new OB was 5.4  - PCP follow up  chiquita l wilson, CMA [facility] [employer], [facility]   [place] [place], [place]

## 2021-10-09 ENCOUNTER — Ambulatory Visit: Payer: BC Managed Care – PPO

## 2021-10-31 ENCOUNTER — Other Ambulatory Visit: Payer: Self-pay

## 2021-10-31 ENCOUNTER — Encounter: Payer: Self-pay | Admitting: *Deleted

## 2021-10-31 ENCOUNTER — Ambulatory Visit (INDEPENDENT_AMBULATORY_CARE_PROVIDER_SITE_OTHER): Payer: BC Managed Care – PPO | Admitting: *Deleted

## 2021-10-31 DIAGNOSIS — Z3042 Encounter for surveillance of injectable contraceptive: Secondary | ICD-10-CM

## 2021-10-31 MED ORDER — MEDROXYPROGESTERONE ACETATE 150 MG/ML IM SUSP
150.0000 mg | Freq: Once | INTRAMUSCULAR | Status: AC
  Administered 2021-10-31: 150 mg via INTRAMUSCULAR

## 2021-10-31 NOTE — Progress Notes (Signed)
Pt here for Depo Provera injection only.  Med given from office stock.

## 2021-12-11 ENCOUNTER — Other Ambulatory Visit (HOSPITAL_COMMUNITY)
Admission: RE | Admit: 2021-12-11 | Discharge: 2021-12-11 | Disposition: A | Discharge status: Home / Self Care | Payer: Medicaid Other | Source: Ambulatory Visit

## 2021-12-11 ENCOUNTER — Other Ambulatory Visit: Payer: Self-pay

## 2021-12-11 ENCOUNTER — Ambulatory Visit (INDEPENDENT_AMBULATORY_CARE_PROVIDER_SITE_OTHER): Payer: BC Managed Care – PPO

## 2021-12-11 VITALS — BP 129/76 | HR 93 | Ht 65.0 in

## 2021-12-11 DIAGNOSIS — Z113 Encounter for screening for infections with a predominantly sexual mode of transmission: Secondary | ICD-10-CM | POA: Insufficient documentation

## 2021-12-11 NOTE — Progress Notes (Signed)
Patient presents for STD check.  Patient would like to be "checked for everything". Patient states she thinks she was exposed to something.    Patient will preform self swab and then go to lab for blood work testing. Will treat based off results. Armandina Stammer RN

## 2021-12-11 NOTE — Progress Notes (Signed)
Chart reviewed - agree with CMA/RN documentation.  ° °

## 2021-12-12 LAB — HEPATITIS C ANTIBODY: Hep C Virus Ab: <0.1 s/co ratio (ref 0.0–0.9)

## 2021-12-12 LAB — CERVICOVAGINAL ANCILLARY ONLY
Chlamydia: NEGATIVE
Comment: NEGATIVE
Comment: NEGATIVE
Comment: NORMAL
Neisseria Gonorrhea: NEGATIVE
Trichomonas: POSITIVE — AB

## 2021-12-12 LAB — HIV ANTIBODY (ROUTINE TESTING W REFLEX): HIV Screen 4th Generation wRfx: NONREACTIVE

## 2021-12-12 LAB — RPR: RPR Ser Ql: NONREACTIVE

## 2021-12-12 LAB — HEPATITIS B SURFACE ANTIGEN: Hepatitis B Surface Ag: NEGATIVE

## 2021-12-13 ENCOUNTER — Telehealth: Payer: Self-pay

## 2021-12-13 MED ORDER — METRONIDAZOLE 500 MG PO TABS: 500.0000 mg | ORAL_TABLET | Freq: Two times a day (BID) | ORAL | 0 refills | Status: DC

## 2021-12-13 NOTE — Telephone Encounter (Signed)
Patient called and made aware of positive trichomonas. Patient made aware that this is an std and she will need to have her partner be tested and treated at the health department. Patient made aware that we will send in Flagyl for her treatment and she will need to take the full course and avoid any alcohol during this treatment. Patient also instructed to abstain from intercourse until her partner is treated otherwise they will continue to pass it back and forth. Patient states understanding. Armandina Stammer RN

## 2022-01-16 ENCOUNTER — Ambulatory Visit: Payer: BC Managed Care – PPO

## 2022-01-25 ENCOUNTER — Ambulatory Visit: Payer: Medicaid Other

## 2022-07-17 ENCOUNTER — Ambulatory Visit (INDEPENDENT_AMBULATORY_CARE_PROVIDER_SITE_OTHER): Payer: Medicaid Other

## 2022-07-17 VITALS — BP 118/75 | HR 88 | Ht 65.0 in | Wt 186.0 lb

## 2022-07-17 DIAGNOSIS — Z3042 Encounter for surveillance of injectable contraceptive: Secondary | ICD-10-CM

## 2022-07-17 LAB — POCT URINE PREGNANCY: Preg Test, Ur: NEGATIVE

## 2022-07-17 NOTE — Progress Notes (Cosign Needed)
Patient interested in restarting her Depo Provera. Last Depo was given in 10/2021. Patient made aware we will do pregnancy test today and then have her return in two weeks for another pregnancy test. Patient scheduled for annual exam. Lannette Donath

## 2022-07-31 ENCOUNTER — Ambulatory Visit (INDEPENDENT_AMBULATORY_CARE_PROVIDER_SITE_OTHER): Payer: Medicaid Other

## 2022-07-31 ENCOUNTER — Other Ambulatory Visit (HOSPITAL_BASED_OUTPATIENT_CLINIC_OR_DEPARTMENT_OTHER): Payer: Self-pay

## 2022-07-31 VITALS — BP 117/69 | HR 90 | Wt 188.0 lb

## 2022-07-31 DIAGNOSIS — Z3042 Encounter for surveillance of injectable contraceptive: Secondary | ICD-10-CM

## 2022-07-31 MED ORDER — MEDROXYPROGESTERONE ACETATE 150 MG/ML IM SUSP
150.0000 mg | Freq: Once | INTRAMUSCULAR | Status: AC
Start: 2022-07-31 — End: 2022-07-31
  Administered 2022-07-31: 150 mg via INTRAMUSCULAR

## 2022-07-31 MED ORDER — MEDROXYPROGESTERONE ACETATE 150 MG/ML IM SUSY
150.0000 mg | PREFILLED_SYRINGE | INTRAMUSCULAR | 3 refills | Status: DC
  Filled 2022-07-31: qty 1, 90d supply, fill #0

## 2022-07-31 NOTE — Progress Notes (Cosign Needed)
Patient returned to clinic for 2nd UPT for restarting depo provera. Patient actually started her period on Monday 07/03/2022. Will restart depo.   Patient to scheduled 3 month visit for next injection. Victorino Dike [facility]

## 2022-07-31 NOTE — Progress Notes (Signed)
Chart reviewed - agree with CMA/RN documentation.  ° °

## 2022-08-09 ENCOUNTER — Other Ambulatory Visit (HOSPITAL_BASED_OUTPATIENT_CLINIC_OR_DEPARTMENT_OTHER): Payer: Self-pay

## 2022-08-13 ENCOUNTER — Ambulatory Visit: Payer: Medicaid Other

## 2022-10-22 ENCOUNTER — Ambulatory Visit: Payer: Medicaid Other | Admitting: Advanced Practice Midwife

## 2022-10-28 ENCOUNTER — Ambulatory Visit: Payer: Medicaid Other | Admitting: Obstetrics and Gynecology

## 2022-10-28 ENCOUNTER — Encounter: Payer: Self-pay | Admitting: Obstetrics and Gynecology

## 2022-10-28 ENCOUNTER — Ambulatory Visit (INDEPENDENT_AMBULATORY_CARE_PROVIDER_SITE_OTHER): Payer: Medicaid Other | Admitting: Obstetrics and Gynecology

## 2022-10-28 ENCOUNTER — Other Ambulatory Visit (HOSPITAL_COMMUNITY)
Admission: RE | Admit: 2022-10-28 | Discharge: 2022-10-28 | Disposition: A | Discharge status: Home / Self Care | Payer: Medicaid Other | Source: Ambulatory Visit

## 2022-10-28 VITALS — BP 114/67 | HR 91 | Ht 65.0 in | Wt 187.0 lb

## 2022-10-28 DIAGNOSIS — Z3042 Encounter for surveillance of injectable contraceptive: Secondary | ICD-10-CM | POA: Diagnosis not present

## 2022-10-28 DIAGNOSIS — Z01419 Encounter for gynecological examination (general) (routine) without abnormal findings: Secondary | ICD-10-CM | POA: Diagnosis present

## 2022-10-28 MED ORDER — MEDROXYPROGESTERONE ACETATE 150 MG/ML IM SUSP
150.0000 mg | Freq: Once | INTRAMUSCULAR | Status: AC
  Administered 2022-10-28: 150 mg via INTRAMUSCULAR

## 2022-10-28 NOTE — Addendum Note (Signed)
Addended by: Lorelle Gibbs L on: 10/28/2022 09:02 AM   Modules accepted: Orders

## 2022-10-28 NOTE — Progress Notes (Signed)
   ANNUAL EXAM Patient name: Laurie Farmer MRN [id number]  Date of birth: 09-Jun-2001 Chief Complaint:   Gynecologic Exam  History of Present Illness:   Laurie Farmer is a 21 y.o. G11P1001 female being seen today for a routine annual exam. Using depo without issue - no intention of pregnancy within the next year. No breast of nipple complaints. No urinary or bowel complaints. Not currently sexually active.  Current complaints: none  No LMP recorded (exact date). Patient has had an injection.   The pregnancy intention screening data noted above was reviewed. Potential methods of contraception were discussed. The patient elected to proceed with No data recorded.   Last pap none prior . Last mammogram: n/a Last colonoscopy: n/a      No data to display               No data to display           Review of Systems:   Pertinent items are noted in HPI Denies any headaches, blurred vision, fatigue, shortness of breath, chest pain, abdominal pain, abnormal vaginal discharge/itching/odor/irritation, problems with periods, bowel movements, urination, or intercourse unless otherwise stated above. Pertinent History Reviewed:  Reviewed past medical,surgical, social and family history.  Reviewed problem list, medications and allergies. Physical Assessment:   Vitals:   10/28/22 0831  BP: 114/67  Pulse: 91  Weight: 187 lb (84.8 kg)  Height: 5\' 5"  (1.651 m)  Body mass index is 31.12 kg/m.        Physical Examination:   General appearance - well appearing, and in no distress  Mental status - alert, oriented to person, place, and time  Psych:  She has a normal mood and affect  Skin - warm and dry, normal color, no suspicious lesions noted  Chest - effort normal, all lung fields clear to auscultation bilaterally  Heart - normal rate and regular rhythm  Breasts - breasts appear normal, no suspicious masses, no skin or nipple changes or axillary nodes  Abdomen - soft, nontender,  nondistended, no masses or organomegaly  Pelvic -  VULVA: normal appearing vulva with no masses, tenderness or lesions   VAGINA: normal appearing vagina with normal color and discharge, no lesions   CERVIX: normal appearing cervix without discharge or lesions, no CMT  Thin prep pap is done with reflex HR HPV cotesting  UTERUS: uterus is felt to be normal size, shape, consistency and nontender   ADNEXA: No adnexal masses or tenderness noted.  Extremities:  No swelling or varicosities noted  Chaperone present for exam  No results found for this or any previous visit (from the past 24 hour(s)).   Assessment & Plan:  1. Well woman exam with routine gynecological exam S/p first pap smear today - will follow up per ASCCP guidelines. Depo shot given today, continue q3 months. Declined serum STI testing today, will follow up GC/CT/trig on pap sample.  - Cytology - PAP( [facility])   Meds: No orders of the defined types were placed in this encounter.   Follow-up: Return in about 3 months (around 01/28/2023).  01/30/2023, MD 10/28/2022 8:55 AM

## 2022-10-29 LAB — CYTOLOGY - PAP
Chlamydia: NEGATIVE
Comment: NEGATIVE
Comment: NEGATIVE
Comment: NORMAL
Diagnosis: NEGATIVE
Neisseria Gonorrhea: NEGATIVE
Trichomonas: NEGATIVE

## 2023-01-13 ENCOUNTER — Other Ambulatory Visit: Payer: Self-pay

## 2023-01-13 ENCOUNTER — Ambulatory Visit (INDEPENDENT_AMBULATORY_CARE_PROVIDER_SITE_OTHER): Payer: Medicaid Other

## 2023-01-13 ENCOUNTER — Other Ambulatory Visit (HOSPITAL_BASED_OUTPATIENT_CLINIC_OR_DEPARTMENT_OTHER): Payer: Self-pay

## 2023-01-13 VITALS — BP 130/75 | HR 99 | Wt 183.0 lb

## 2023-01-13 DIAGNOSIS — Z3042 Encounter for surveillance of injectable contraceptive: Secondary | ICD-10-CM | POA: Diagnosis not present

## 2023-01-13 MED ORDER — MEDROXYPROGESTERONE ACETATE 150 MG/ML IM SUSP
150.0000 mg | Freq: Once | INTRAMUSCULAR | Status: AC
Start: 2023-01-13 — End: 2023-01-13
  Administered 2023-01-13: 150 mg via INTRAMUSCULAR

## 2023-01-13 MED ORDER — MEDROXYPROGESTERONE ACETATE 150 MG/ML IM SUSP: 150.0000 mg | INTRAMUSCULAR | 3 refills | Status: DC

## 2023-01-13 MED ORDER — MEDROXYPROGESTERONE ACETATE 150 MG/ML IM SUSY
150.0000 mg | PREFILLED_SYRINGE | INTRAMUSCULAR | 3 refills | Status: DC
  Filled 2023-01-13: qty 1, 90d supply, fill #0
  Filled 2023-04-09: qty 1, 90d supply, fill #1

## 2023-01-13 NOTE — Progress Notes (Signed)
Date last pap: 10/28/22. Last Depo-Provera: 10/28/22. Side Effects if any: none. Serum HCG indicated? no. Depo-Provera 150 mg IM given by: Sharlene Dory RN. Next appointment due 3 months - scheduled by front desk before leaving today. Kathrene Alu RN

## 2023-04-07 ENCOUNTER — Ambulatory Visit: Payer: Medicaid Other

## 2023-04-09 ENCOUNTER — Ambulatory Visit: Payer: Medicaid Other

## 2023-04-09 ENCOUNTER — Other Ambulatory Visit: Payer: Self-pay

## 2023-04-09 ENCOUNTER — Other Ambulatory Visit (HOSPITAL_BASED_OUTPATIENT_CLINIC_OR_DEPARTMENT_OTHER): Payer: Self-pay

## 2023-04-09 MED ORDER — MEDROXYPROGESTERONE ACETATE 150 MG/ML IM SUSP: 150.0000 mg | INTRAMUSCULAR | 0 refills | Status: DC

## 2023-04-18 ENCOUNTER — Other Ambulatory Visit (HOSPITAL_BASED_OUTPATIENT_CLINIC_OR_DEPARTMENT_OTHER): Payer: Self-pay

## 2023-05-08 ENCOUNTER — Telehealth: Payer: Self-pay

## 2023-05-08 NOTE — Telephone Encounter (Signed)
Called patient to schedule appointment for Depo injection. Left message for patient to call the office back. Maximiano Lott l Jamie Hafford, CMA

## 2023-05-13 ENCOUNTER — Ambulatory Visit: Payer: Medicaid Other

## 2023-05-15 ENCOUNTER — Ambulatory Visit: Payer: Medicaid Other

## 2023-06-08 ENCOUNTER — Ambulatory Visit
Admission: EM | Admit: 2023-06-08 | Discharge: 2023-06-08 | Disposition: A | Discharge status: Home / Self Care | Payer: Medicaid Other | Attending: Family Medicine | Admitting: Family Medicine

## 2023-06-08 ENCOUNTER — Other Ambulatory Visit: Payer: Self-pay

## 2023-06-08 ENCOUNTER — Encounter: Payer: Self-pay | Admitting: Emergency Medicine

## 2023-06-08 DIAGNOSIS — L0291 Cutaneous abscess, unspecified: Secondary | ICD-10-CM | POA: Diagnosis not present

## 2023-06-08 MED ORDER — AMOXICILLIN-POT CLAVULANATE 875-125 MG PO TABS: 1.0000 | ORAL_TABLET | Freq: Two times a day (BID) | ORAL | 0 refills | Status: DC

## 2023-06-08 MED ORDER — IBUPROFEN 800 MG PO TABS: 800.0000 mg | ORAL_TABLET | Freq: Three times a day (TID) | ORAL | 0 refills | Status: DC

## 2023-06-08 NOTE — ED Triage Notes (Signed)
Pt here for abscess like area to right inner thigh.  Induration noted. No fevers.  Pt denies drainage. Has tried warm soaks to help but hasn't done anything per pt.

## 2023-06-08 NOTE — Discharge Instructions (Signed)
Keep bandage on area until it stops draining The gauze keeping wound open will fall out in a day or 2 Warm compresses may help May soak and bathe normally tomorrow Take the antibiotic 2 times a day with food Take ibuprofen as needed for pain Return as needed

## 2023-06-08 NOTE — ED Provider Notes (Signed)
[facility]    CSN: [id number] Arrival date & time: 06/08/23  1325      History   Chief Complaint Chief Complaint  Patient presents with   Abscess    HPI Laurie Farmer is a 22 y.o. female.   HPI  Large abscess on the inside of the left thigh, upper region.  Patient states she can hardly walk.  She has been using warm compresses.  It is very painful.  No fever or malaise Past Medical History:  Diagnosis Date   Pregnancy induced hypertension     Patient Active Problem List   Diagnosis Date Noted   Depo-Provera contraceptive status 07/24/2021   Vaginal delivery 06/16/2021   Tinea corporis 01/16/2021    Past Surgical History:  Procedure Laterality Date   WISDOM TOOTH EXTRACTION  2017    OB History     Gravida  1   Para  1   Term  1   Preterm      AB      Living  1      SAB      IAB      Ectopic      Multiple  0   Live Births  1            Home Medications    Prior to Admission medications   Medication Sig Start Date End Date Taking? Authorizing Provider  amoxicillin-clavulanate (AUGMENTIN) 875-125 MG tablet Take 1 tablet by mouth every 12 (twelve) hours. 06/08/23  Yes Eustace Moore, MD  ibuprofen (ADVIL) 800 MG tablet Take 1 tablet (800 mg total) by mouth 3 (three) times daily. 06/08/23  Yes Eustace Moore, MD    Family History Family History  Problem Relation Age of Onset   Hypertension Mother    Hypertension Father     Social History Social History   Tobacco Use   Smoking status: Never   Smokeless tobacco: Never  Vaping Use   Vaping Use: Former   Substances: Flavoring  Substance Use Topics   Alcohol use: Never   Drug use: Never     Allergies   Patient has no known allergies.   Review of Systems Review of Systems  See HPI Physical Exam Triage Vital Signs ED Triage Vitals [06/08/23 1345]  Enc Vitals Group     BP (!) 145/79     Pulse Rate (!) 102     Resp 14     Temp 98.9 F (37.2 C)      Temp Source Oral     SpO2 99 %     Weight 180 lb (81.6 kg)     Height 5\' 5"  (1.651 m)     Head Circumference      Peak Flow      Pain Score 10     Pain Loc      Pain Edu?      Excl. in GC?    No data found.  Updated Vital Signs BP (!) 145/79   Pulse (!) 102   Temp 98.9 F (37.2 C) (Oral)   Resp 14   Ht 5\' 5"  (1.651 m)   Wt 81.6 kg   SpO2 99%   BMI 29.95 kg/m       Physical Exam   UC Treatments / Results  Labs (all labs ordered are listed, but only abnormal results are displayed) Labs Reviewed - No data to display  EKG   Radiology No results found.  Procedures Incision and Drainage  Date/Time: 06/08/2023 2:55 PM  Performed by: Eustace Moore, MD Authorized by: Eustace Moore, MD   Consent:    Consent obtained:  Verbal   Consent given by:  Patient Universal protocol:    Patient identity confirmed:  Verbally with patient Location:    Type:  Abscess   Location:  Lower extremity   Lower extremity location:  Leg   Leg location:  L upper leg Pre-procedure details:    Skin preparation:  Antiseptic wash Sedation:    Sedation type:  None Anesthesia:    Anesthesia method:  Local infiltration   Local anesthetic:  Lidocaine 1% WITH epi Procedure type:    Complexity:  Simple Procedure details:    Ultrasound guidance: no     Needle aspiration: no     Incision types:  Stab incision   Incision depth:  Subcutaneous   Wound management:  Probed and deloculated   Drainage:  Purulent   Drainage amount:  Copious   Wound treatment:  Wound left open and drain placed   Packing materials:  1/4 in iodoform gauze   Amount 1/4" iodoform:  2 inches Post-procedure details:    Procedure completion:  Tolerated Comments:     Copious purulent malodorous discharge  (including critical care time)  Medications Ordered in UC Medications - No data to display  Initial Impression / Assessment and Plan / UC Course  I have reviewed the triage vital signs and the  nursing notes.  Pertinent labs & imaging results that were available during my care of the patient were reviewed by me and considered in my medical decision making (see chart for details).     Discussed wound care Final Clinical Impressions(s) / UC Diagnoses   Final diagnoses:  Abscess     Discharge Instructions      Keep bandage on area until it stops draining The gauze keeping wound open will fall out in a day or 2 Warm compresses may help May soak and bathe normally tomorrow Take the antibiotic 2 times a day with food Take ibuprofen as needed for pain Return as needed   ED Prescriptions     Medication Sig Dispense Auth. Provider   amoxicillin-clavulanate (AUGMENTIN) 875-125 MG tablet Take 1 tablet by mouth every 12 (twelve) hours. 14 tablet Eustace Moore, MD   ibuprofen (ADVIL) 800 MG tablet Take 1 tablet (800 mg total) by mouth 3 (three) times daily. 21 tablet Eustace Moore, MD      PDMP not reviewed this encounter.   Eustace Moore, MD 06/08/23 [phone]

## 2023-06-10 ENCOUNTER — Ambulatory Visit
Admission: EM | Admit: 2023-06-10 | Discharge: 2023-06-10 | Disposition: A | Discharge status: Home / Self Care | Payer: Medicaid Other | Attending: Internal Medicine | Admitting: Internal Medicine

## 2023-06-10 ENCOUNTER — Encounter: Payer: Self-pay | Admitting: Emergency Medicine

## 2023-06-10 DIAGNOSIS — K208 Other esophagitis without bleeding: Secondary | ICD-10-CM

## 2023-06-10 DIAGNOSIS — L0291 Cutaneous abscess, unspecified: Secondary | ICD-10-CM

## 2023-06-10 DIAGNOSIS — T50905A Adverse effect of unspecified drugs, medicaments and biological substances, initial encounter: Secondary | ICD-10-CM

## 2023-06-10 MED ORDER — MUPIROCIN 2 % EX OINT: 1.0000 | TOPICAL_OINTMENT | Freq: Three times a day (TID) | CUTANEOUS | 0 refills | Status: AC | PRN

## 2023-06-10 MED ORDER — OMEPRAZOLE 20 MG PO CPDR: 20.0000 mg | DELAYED_RELEASE_CAPSULE | Freq: Every day | ORAL | 0 refills | Status: DC

## 2023-06-10 NOTE — ED Triage Notes (Signed)
Pt presents to UC with concerns for having a pill stuck in her throat. States she took an antibiotic last night and feels like the pill is still present. Denies trouble swallow, able to speak in full sentences and swallow secretions.

## 2023-06-10 NOTE — ED Provider Notes (Signed)
[facility]  Note:  This document was prepared using Dragon voice recognition software and may include unintentional dictation errors.  MRN: [id number] DOB: 2001/05/09 DATE: 06/10/23   Subjective:  Chief Complaint:  Chief Complaint  Patient presents with   Pill Stuck in Throat    HPI: Laurie Farmer is a 22 y.o. female presenting for possible pill stuck in her throat. She states she was seen in [place] and started on an antibiotic for a lesion on her leg. Per her chart, she was seen on 06/08/2023 for an abscess and prescribed Augmentin. She states she took her antibiotic last night before going to bed and feels as if the pill is still stuck in her throat. She reports taking the pill with water, but feels as if it is stuck and never went down. She has been drinking soda and ate some bread with no relief. Reports vomiting twice this morning and reports coughing as well. Denies fever, nausea, abdominal pain. Endorses throat pain. Presents NAD.  Prior to Admission medications   Medication Sig Start Date End Date Taking? Authorizing Provider  mupirocin ointment (BACTROBAN) 2 % Apply 1 Application topically 3 (three) times daily as needed for up to 10 days. 06/10/23 06/20/23 Yes Lieutenant Abarca P, PA-C  omeprazole (PRILOSEC) 20 MG capsule Take 1 capsule (20 mg total) by mouth daily. 06/10/23 07/10/23 Yes Britanie Harshman P, PA-C  ibuprofen (ADVIL) 800 MG tablet Take 1 tablet (800 mg total) by mouth 3 (three) times daily. 06/08/23   Eustace Moore, MD     No Known Allergies  History:   Past Medical History:  Diagnosis Date   Pregnancy induced hypertension      Past Surgical History:  Procedure Laterality Date   WISDOM TOOTH EXTRACTION  2017    Family History  Problem Relation Age of Onset   Hypertension Mother    Hypertension Father     Social History   Tobacco Use   Smoking status: Never   Smokeless tobacco: Never  Vaping Use   Vaping Use: Former   Substances:  Flavoring  Substance Use Topics   Alcohol use: Never   Drug use: Never    Review of Systems  Constitutional:  Negative for fever.  HENT:  Positive for sore throat. Negative for drooling and trouble swallowing.   Respiratory:  Positive for cough.   Gastrointestinal:  Positive for vomiting. Negative for abdominal pain and nausea.     Objective:   Vitals: BP 127/74 (BP Location: Right Arm)   Pulse 84   Temp 98.7 F (37.1 C) (Oral)   Resp 16   LMP  (LMP Unknown)   SpO2 98%   Physical Exam Constitutional:      General: She is not in acute distress.    Appearance: Normal appearance. She is well-developed and overweight. She is not ill-appearing or toxic-appearing.  HENT:     Head: Normocephalic and atraumatic.     Mouth/Throat:     Pharynx: Uvula midline. No pharyngeal swelling, oropharyngeal exudate or posterior oropharyngeal erythema.     Tonsils: No tonsillar exudate or tonsillar abscesses.     Comments: Airway patent  Cardiovascular:     Rate and Rhythm: Normal rate and regular rhythm.     Heart sounds: Normal heart sounds.  Pulmonary:     Effort: Pulmonary effort is normal.     Breath sounds: Normal breath sounds.     Comments: Clear to auscultation bilaterally   Abdominal:     General: Bowel  sounds are normal.     Palpations: Abdomen is soft.     Tenderness: There is no abdominal tenderness.  Lymphadenopathy:     Cervical: No cervical adenopathy.  Skin:    General: Skin is warm and dry.  Neurological:     General: No focal deficit present.     Mental Status: She is alert.  Psychiatric:        Mood and Affect: Mood and affect normal.     Results:  Labs: No results found for this or any previous visit (from the past 24 hour(s)).  Radiology: No results found.   UC Course/Treatments:  Procedures: Procedures   Medications Ordered in UC: Medications - No data to display   Assessment and Plan :     ICD-10-CM   1. Pill esophagitis  K20.80     T50.905A     2. Abscess  L02.91      Pill esophagitis Afebrile, nontoxic-appearing, NAD. VSS. DDX includes but not limited to: pill esophagitis, GERD, abrasion, aspiration, foreign body  Explained to the patient our office is unable to perform any imaging that would look for any retained medication. Suspect pill esophagitis. I recommend she stop the Augmentin at this time. Omeprazole 20mg  every day was prescribed to help with any underlying GERD symptoms and help with any pill-induced injury. Recommend she continue with water and applesauce to help with the discomfort. She was instructed to follow up with her PCP this week if no improvement. PCP assistance was requested. Strict ED precautions were given and patient verbalized understanding.  Abscess Previously diagnosed. I&D preformed on 06/08/2023. Given suspected pill esophagitis, Augmentin was stopped. Mupirocin 1 application TID was prescribed to use since the antibiotic was stopped. Recommend follow up with PCP.  Strict ED precautions were given and patient verbalized understanding.   ED Discharge Orders          Ordered    omeprazole (PRILOSEC) 20 MG capsule  Daily        06/10/23 0925    mupirocin ointment (BACTROBAN) 2 %  3 times daily PRN        06/10/23 0925             PDMP not reviewed this encounter.     Cynda Acres, PA-C 06/10/23 1610

## 2023-06-10 NOTE — Discharge Instructions (Addendum)
Please noted our office is limited in the tests and imaging we can perform at our facility. Your evaluation was not suggestive of any emergent condition requiring medical intervention at this time. However, some problems make take more time to appear. Therefore, it is very important for you to pay attention to any new symptoms or worsening of your current condition.   Please go directly to the Emergency Department immediately should you begin to feel worse in any way or have any of the following symptoms: fevers, drooling, difficulty breathing, persistent vomiting, inability to drink fluids, vomiting blood, shortness of breath, increasing or different abdominal pain.   I recommend you stop the antibiotic at this time and I will send a medication in to your pharmacy to help with esophagitis. I recommend you follow up with your PCP this week if symptoms continue.   I have also sent you a topical antibiotic to put on your leg since you are stopping the antibiotic.

## 2023-06-23 ENCOUNTER — Ambulatory Visit: Payer: Medicaid Other | Admitting: Family Medicine

## 2023-09-17 ENCOUNTER — Ambulatory Visit: Payer: Medicaid Other

## 2023-09-17 VITALS — BP 113/61 | HR 86 | Wt 189.0 lb

## 2023-09-17 DIAGNOSIS — Z3202 Encounter for pregnancy test, result negative: Secondary | ICD-10-CM

## 2023-09-17 DIAGNOSIS — Z3042 Encounter for surveillance of injectable contraceptive: Secondary | ICD-10-CM

## 2023-09-17 LAB — POCT URINE PREGNANCY: Preg Test, Ur: NEGATIVE

## 2023-09-17 NOTE — Progress Notes (Signed)
Patient presents for 1st UPT to restart Depo. UPT; negative. Advised patient to not have unprotected sex for 2 weeks. Patient is scheduled to return in 2 weeks for repeat UPT and Depo injection.  Laurie Farmer l Florabel Faulks, CMA

## 2023-10-01 ENCOUNTER — Ambulatory Visit: Payer: Medicaid Other

## 2023-10-02 ENCOUNTER — Ambulatory Visit: Payer: Medicaid Other

## 2023-11-14 ENCOUNTER — Ambulatory Visit (INDEPENDENT_AMBULATORY_CARE_PROVIDER_SITE_OTHER): Payer: Medicaid Other | Admitting: *Deleted

## 2023-11-14 DIAGNOSIS — Z3202 Encounter for pregnancy test, result negative: Secondary | ICD-10-CM

## 2023-11-14 LAB — POCT URINE PREGNANCY: Preg Test, Ur: NEGATIVE

## 2023-11-14 NOTE — Progress Notes (Signed)
Pt here for UPT so that she can start her Depo.  UPT is neg and pt instructed to either abstain or use BC protection until her Depo that will be given in 2 weeks.

## 2023-11-28 ENCOUNTER — Ambulatory Visit: Payer: Medicaid Other

## 2023-12-09 ENCOUNTER — Ambulatory Visit: Payer: Medicaid Other

## 2023-12-31 ENCOUNTER — Ambulatory Visit: Payer: Medicaid Other | Admitting: Family Medicine

## 2023-12-31 ENCOUNTER — Ambulatory Visit: Payer: Medicaid Other | Admitting: Obstetrics & Gynecology

## 2024-01-06 ENCOUNTER — Other Ambulatory Visit (HOSPITAL_COMMUNITY)
Admission: RE | Admit: 2024-01-06 | Discharge: 2024-01-06 | Disposition: A | Discharge status: Home / Self Care | Payer: Medicaid Other | Source: Ambulatory Visit | Attending: Nurse Practitioner | Admitting: Nurse Practitioner

## 2024-01-06 ENCOUNTER — Ambulatory Visit (INDEPENDENT_AMBULATORY_CARE_PROVIDER_SITE_OTHER): Payer: Medicaid Other

## 2024-01-06 VITALS — BP 121/69 | HR 91 | Ht 65.0 in | Wt 196.0 lb

## 2024-01-06 DIAGNOSIS — Z202 Contact with and (suspected) exposure to infections with a predominantly sexual mode of transmission: Secondary | ICD-10-CM | POA: Insufficient documentation

## 2024-01-06 DIAGNOSIS — Z113 Encounter for screening for infections with a predominantly sexual mode of transmission: Secondary | ICD-10-CM

## 2024-01-06 NOTE — Progress Notes (Signed)
 SUBJECTIVE:  23 y.o. female who desires a STI screen. Denies abnormal vaginal discharge, bleeding or significant pelvic pain. No UTI symptoms. Denies history of known exposure to STD.   OBJECTIVE:  She appears well.   ASSESSMENT:  STI Screen   PLAN:  GC, chlamydia, and trichomonas probe sent to lab.  Treatment: To be determined once lab results are received.  Pt follow up as needed.   Erminio DELENA Rumps, RN

## 2024-01-07 ENCOUNTER — Other Ambulatory Visit: Payer: Self-pay | Admitting: Family Medicine

## 2024-01-07 ENCOUNTER — Encounter: Payer: Self-pay | Admitting: Family Medicine

## 2024-01-07 LAB — CERVICOVAGINAL ANCILLARY ONLY
Bacterial Vaginitis (gardnerella): POSITIVE — AB
Candida Glabrata: NEGATIVE
Candida Vaginitis: NEGATIVE
Chlamydia: NEGATIVE
Comment: NEGATIVE
Comment: NEGATIVE
Comment: NEGATIVE
Comment: NEGATIVE
Comment: NEGATIVE
Comment: NORMAL
Neisseria Gonorrhea: NEGATIVE
Trichomonas: NEGATIVE

## 2024-01-07 MED ORDER — METRONIDAZOLE 0.75 % VA GEL: 1.0000 | Freq: Every day | VAGINAL | 1 refills | Status: DC

## 2024-01-16 ENCOUNTER — Ambulatory Visit: Payer: Medicaid Other | Admitting: Obstetrics and Gynecology

## 2024-05-31 ENCOUNTER — Ambulatory Visit

## 2024-06-01 ENCOUNTER — Ambulatory Visit

## 2024-06-01 DIAGNOSIS — Z3201 Encounter for pregnancy test, result positive: Secondary | ICD-10-CM | POA: Diagnosis not present

## 2024-06-01 NOTE — Progress Notes (Signed)
 [facility] here for a UPT. Pt had a positive upt at home. LMP is 04/26/2024.     UPT in office Positive.    Reviewed medications and informed to start a PNV, if not already. Pt to follow up in 2 days for New OB intake.

## 2024-06-03 ENCOUNTER — Other Ambulatory Visit: Payer: Self-pay

## 2024-06-03 ENCOUNTER — Other Ambulatory Visit (HOSPITAL_COMMUNITY)
Admission: RE | Admit: 2024-06-03 | Discharge: 2024-06-03 | Disposition: A | Discharge status: Home / Self Care | Source: Ambulatory Visit | Attending: Family Medicine | Admitting: Family Medicine

## 2024-06-03 ENCOUNTER — Ambulatory Visit

## 2024-06-03 VITALS — BP 117/69 | HR 87 | Wt 182.0 lb

## 2024-06-03 DIAGNOSIS — O0990 Supervision of high risk pregnancy, unspecified, unspecified trimester: Secondary | ICD-10-CM | POA: Insufficient documentation

## 2024-06-03 LAB — POCT URINE PREGNANCY: Preg Test, Ur: POSITIVE — AB

## 2024-06-03 NOTE — Progress Notes (Signed)
 New OB Intake  I explained I am completing New OB Intake today. We discussed EDD of 01/25/2025, by Last Menstrual Period. Pt is G2P1001. I reviewed her allergies, medications and Medical/Surgical/OB history.    Patient Active Problem List   Diagnosis Date Noted   Supervision of high risk pregnancy, antepartum 06/03/2024   Depo-Provera  contraceptive status 07/24/2021   Vaginal delivery 06/16/2021   Tinea corporis 01/16/2021    Concerns addressed today  Patient informed that the ultrasound is considered a limited obstetric ultrasound and is not intended to be a complete ultrasound exam.  Patient also informed that the ultrasound is not being completed with the intent of assessing for fetal or placental anomalies or any pelvic abnormalities. Explained that the purpose of today's ultrasound is to assess for viability.  Patient acknowledges the purpose of the exam and the limitations of the study.     Delivery Plans Plans to deliver at [facility]. Discussed the nature of our practice with multiple providers including residents and students. Due to the size of the practice, the delivering provider may not be the same as those providing prenatal care.   MyChart/Babyscripts MyChart access verified. I explained pt will have some visits in office and some virtually. Babyscripts app discussed and ordered.   Blood Pressure Cuff Blood pressure cuff providedDiscussed to be used for virtual visits and or if needed BP checks weekly.  Anatomy US  Explained first scheduled US  will be around 19 weeks.   Last Pap Diagnosis  Date Value Ref Range Status  10/28/2022   Final   - Negative for intraepithelial lesion or malignancy (NILM)    First visit review I reviewed new OB appt with patient. Explained pt will be seen by Dr. Barbra at first visit. Discussed [place] genetic screening with patient . Routine prenatal labs ordered.    Erminio DELENA Rumps, [place] 06/03/2024  9:54 AM

## 2024-06-04 LAB — CBC/D/PLT+RPR+RH+ABO+RUBIGG...
Antibody Screen: NEGATIVE
Basophils Absolute: 0 x10E3/uL (ref 0.0–0.2)
Basos: 1 %
EOS (ABSOLUTE): 0.2 x10E3/uL (ref 0.0–0.4)
Eos: 5 %
HCV Ab: NONREACTIVE
HIV Screen 4th Generation wRfx: NONREACTIVE
Hematocrit: 39.7 % (ref 34.0–46.6)
Hemoglobin: 12.1 g/dL (ref 11.1–15.9)
Hepatitis B Surface Ag: NEGATIVE
Immature Grans (Abs): 0 x10E3/uL (ref 0.0–0.1)
Immature Granulocytes: 0 %
Lymphocytes Absolute: 1.7 x10E3/uL (ref 0.7–3.1)
Lymphs: 42 %
MCH: 24.1 pg — ABNORMAL LOW (ref 26.6–33.0)
MCHC: 30.5 g/dL — ABNORMAL LOW (ref 31.5–35.7)
MCV: 79 fL (ref 79–97)
Monocytes Absolute: 0.4 x10E3/uL (ref 0.1–0.9)
Monocytes: 9 %
Neutrophils Absolute: 1.7 x10E3/uL (ref 1.4–7.0)
Neutrophils: 43 %
Platelets: 302 x10E3/uL (ref 150–450)
RBC: 5.02 x10E6/uL (ref 3.77–5.28)
RDW: 15 % (ref 11.7–15.4)
RPR Ser Ql: NONREACTIVE
Rh Factor: POSITIVE
Rubella Antibodies, IGG: 2.4 {index} (ref >0.99)
WBC: 3.9 x10E3/uL (ref 3.4–10.8)

## 2024-06-04 LAB — HCV INTERPRETATION

## 2024-06-06 LAB — CULTURE, OB URINE

## 2024-06-06 LAB — URINE CULTURE, OB REFLEX: Organism ID, Bacteria: NO GROWTH

## 2024-06-07 ENCOUNTER — Ambulatory Visit: Payer: Self-pay | Admitting: Family Medicine

## 2024-06-07 LAB — CERVICOVAGINAL ANCILLARY ONLY
Chlamydia: NEGATIVE
Comment: NEGATIVE
Comment: NORMAL
Neisseria Gonorrhea: NEGATIVE

## 2024-06-09 ENCOUNTER — Inpatient Hospital Stay (HOSPITAL_COMMUNITY)
Admission: AD | Admit: 2024-06-09 | Discharge: 2024-06-09 | Disposition: A | Discharge status: Home / Self Care | Attending: Obstetrics and Gynecology | Admitting: Obstetrics and Gynecology

## 2024-06-09 ENCOUNTER — Encounter (HOSPITAL_COMMUNITY): Payer: Self-pay | Admitting: Obstetrics and Gynecology

## 2024-06-09 ENCOUNTER — Telehealth: Payer: Self-pay

## 2024-06-09 ENCOUNTER — Inpatient Hospital Stay (HOSPITAL_COMMUNITY)

## 2024-06-09 DIAGNOSIS — O26891 Other specified pregnancy related conditions, first trimester: Secondary | ICD-10-CM | POA: Diagnosis present

## 2024-06-09 DIAGNOSIS — O209 Hemorrhage in early pregnancy, unspecified: Secondary | ICD-10-CM | POA: Insufficient documentation

## 2024-06-09 DIAGNOSIS — R1030 Lower abdominal pain, unspecified: Secondary | ICD-10-CM | POA: Insufficient documentation

## 2024-06-09 DIAGNOSIS — O3680X0 Pregnancy with inconclusive fetal viability, not applicable or unspecified: Secondary | ICD-10-CM

## 2024-06-09 DIAGNOSIS — Z3A01 Less than 8 weeks gestation of pregnancy: Secondary | ICD-10-CM | POA: Diagnosis not present

## 2024-06-09 LAB — CBC
HCT: 36.4 % (ref 36.0–46.0)
Hemoglobin: 11.6 g/dL — ABNORMAL LOW (ref 12.0–15.0)
MCH: 24 pg — ABNORMAL LOW (ref 26.0–34.0)
MCHC: 31.9 g/dL (ref 30.0–36.0)
MCV: 75.4 fL — ABNORMAL LOW (ref 80.0–100.0)
Platelets: 309 K/uL (ref 150–400)
RBC: 4.83 MIL/uL (ref 3.87–5.11)
RDW: 14.7 % (ref 11.5–15.5)
WBC: 8.2 K/uL (ref 4.0–10.5)
nRBC: 0 % (ref 0.0–0.2)

## 2024-06-09 LAB — ABO/RH: ABO/RH(D): O POS

## 2024-06-09 LAB — WET PREP, GENITAL
Sperm: NONE SEEN
Trich, Wet Prep: NONE SEEN
WBC, Wet Prep HPF POC: <10 (ref <10)
Yeast Wet Prep HPF POC: NONE SEEN

## 2024-06-09 LAB — HCG, QUANTITATIVE, PREGNANCY: hCG, Beta Chain, Quant, S: 1560 m[IU]/mL — ABNORMAL HIGH (ref <5)

## 2024-06-09 NOTE — Telephone Encounter (Signed)
 Patient called stating she is spotting and is unsure if it is normal. Informed patient that light spotting is normal but if she starts to bleed heavy like a period or if she starts having pain, we recommend she go to [facility] at [address].  Rosabelle Jupin l Grady Lucci, CMA

## 2024-06-09 NOTE — MAU Provider Note (Signed)
 History     CSN: [id number]  Arrival date and time: 06/09/24 1730   Event Date/Time   First Provider Initiated Contact with Patient 06/09/24 2101      Chief Complaint  Patient presents with   Abdominal Pain   Vaginal Bleeding   HPI [name] is a 23 y.o. year old G44P1001 female at [redacted]w[redacted]d weeks gestation who presents to MAU reporting she called her OB office at 1500 for light VB that is now heavier. She also reports lower abdominal cramping. She denies recent SI or abnormal vaginal d/c. She receives [facility] with [facility]; next appt is 07/14/2024.    OB History     Gravida  2   Para  1   Term  1   Preterm      AB      Living  1      SAB      IAB      Ectopic      Multiple  0   Live Births  1           Past Medical History:  Diagnosis Date   Pregnancy induced hypertension     Past Surgical History:  Procedure Laterality Date   WISDOM TOOTH EXTRACTION  2017    Family History  Problem Relation Age of Onset   Hypertension Mother    Hypertension Father    Cancer Maternal Grandmother    Cancer Maternal Grandfather    Cancer Paternal Grandmother    Cancer Paternal Grandfather     Social History   Tobacco Use   Smoking status: Never   Smokeless tobacco: Never  Vaping Use   Vaping status: Former   Substances: Flavoring  Substance Use Topics   Alcohol use: Not Currently   Drug use: Never    Allergies: No Known Allergies  Medications Prior to Admission  Medication Sig Dispense Refill Last Dose/Taking   metroNIDAZOLE  (METROGEL ) 0.75 % vaginal gel Place 1 Applicatorful vaginally at bedtime. Apply one applicatorful to vagina at bedtime for 5 days (Patient not taking: Reported on 06/03/2024) 70 g 1    ibuprofen  (ADVIL ) 800 MG tablet Take 1 tablet (800 mg total) by mouth 3 (three) times daily. (Patient not taking: Reported on 06/03/2024) 21 tablet 0    norelgestromin-ethinyl estradiol (XULANE) 150-35 MCG/24HR transdermal patch Place 1 patch onto  the skin once a week. (Patient not taking: Reported on 06/03/2024)      omeprazole  (PRILOSEC) 20 MG capsule Take 1 capsule (20 mg total) by mouth daily. (Patient not taking: Reported on 06/03/2024) 30 capsule 0    Prenatal Vit-Fe Fumarate-FA (PRENATAL VITAMINS PO) Take 1 capsule by mouth daily.       Review of Systems  Constitutional: Negative.   HENT: Negative.    Eyes: Negative.   Respiratory: Negative.    Cardiovascular: Negative.   Gastrointestinal: Negative.   Endocrine: Negative.   Genitourinary:  Positive for pelvic pain (cramping) and vaginal bleeding.  Musculoskeletal: Negative.   Skin: Negative.   Allergic/Immunologic: Negative.   Neurological: Negative.   Hematological: Negative.   Psychiatric/Behavioral: Negative.     Physical Exam   Blood pressure 135/76, pulse 86, temperature 98.6 F (37 C), temperature source Oral, resp. rate 16, height 5' 5 (1.651 m), weight 83.2 kg, last menstrual period 04/20/2024, SpO2 100%, currently breastfeeding.  Physical Exam Vitals and nursing note reviewed.  Constitutional:      Appearance: Normal appearance. She is normal weight.  Cardiovascular:     Rate  and Rhythm: Normal rate.  Pulmonary:     Effort: Pulmonary effort is normal.  Abdominal:     Palpations: Abdomen is soft.  Genitourinary:    Comments: Swabs collected by patient using blind swab technique  Musculoskeletal:        General: Normal range of motion.  Skin:    General: Skin is warm and dry.  Neurological:     Mental Status: She is alert and oriented to person, place, and time.  Psychiatric:        Mood and Affect: Mood normal.        Behavior: Behavior normal.        Thought Content: Thought content normal.        Judgment: Judgment normal.    MAU Course  Procedures  MDM CCUA UPT CBC ABO/Rh HCG Wet Prep GC/CT -- Results pending  RPR -- Results pending  OB U/S < 14 wks TVUS  Results for orders placed or performed during the hospital encounter of  06/09/24 (from the past 24 hours)  Wet prep, genital     Status: Abnormal   Collection Time: 06/09/24  7:17 PM  Result Value Ref Range   Yeast Wet Prep HPF POC NONE SEEN NONE SEEN   Trich, Wet Prep NONE SEEN NONE SEEN   Clue Cells Wet Prep HPF POC PRESENT (A) NONE SEEN   WBC, Wet Prep HPF POC <10 <10   Sperm NONE SEEN   CBC     Status: Abnormal   Collection Time: 06/09/24  7:36 PM  Result Value Ref Range   WBC 8.2 4.0 - 10.5 K/uL   RBC 4.83 3.87 - 5.11 MIL/uL   Hemoglobin 11.6 (L) 12.0 - 15.0 g/dL   HCT 63.5 63.9 - 53.9 %   MCV 75.4 (L) 80.0 - 100.0 fL   MCH 24.0 (L) 26.0 - 34.0 pg   MCHC 31.9 30.0 - 36.0 g/dL   RDW 85.2 88.4 - 84.4 %   Platelets 309 150 - 400 K/uL   nRBC 0.0 0.0 - 0.2 %  ABO/Rh     Status: None   Collection Time: 06/09/24  7:36 PM  Result Value Ref Range   ABO/RH(D) O POS    No rh immune globuloin      NOT A RH IMMUNE GLOBULIN CANDIDATE, PT RH POSITIVE Performed at [facility], [address] [place], [place] [address]   hCG, quantitative, pregnancy     Status: Abnormal   Collection Time: 06/09/24  7:36 PM  Result Value Ref Range   hCG, Beta Chain, Quant, S 1,560 (H) <5 mIU/mL    US  OB LESS THAN 14 WEEKS WITH OB TRANSVAGINAL Result Date: 06/09/2024 CLINICAL DATA:  Moderate bleeding today. EXAM: OBSTETRIC <14 WK US  AND TRANSVAGINAL OB US  TECHNIQUE: Both transabdominal and transvaginal ultrasound examinations were performed for complete evaluation of the gestation as well as the maternal uterus, adnexal regions, and pelvic cul-de-sac. Transvaginal technique was performed to assess early pregnancy. COMPARISON:  No recent studies are available for review. FINDINGS: Intrauterine gestational sac: None Yolk sac:  No Embryo:  No Cardiac Activity: No Heart Rate: None Subchorionic hemorrhage:  None visualized. Maternal uterus/adnexae: The ovaries are within normal limits. A small amount of free fluid is present in the pelvis. IMPRESSION: 1. No evidence of  intrauterine pregnancy, which may be due to early pregnancy versus complete miscarriage. Correlation with beta HCG is recommended and follow-up ultrasound may be obtained if clinically warranted. 2. Small amount of  free fluid in the pelvis which may be physiologic. Electronically Signed   By: Leita Birmingham M.D.   On: 06/09/2024 20:53      Assessment and Plan  1. Vaginal bleeding affecting early pregnancy (Primary) - Information provided on VB in pregnancy - Return to MAU: If you have heavier bleeding that soaks through more that 2 pads per hour for an hour or more If you bleed so much that you feel like you might pass out or you do pass out If you have significant abdominal pain that is not improved with Tylenol  1000 mg every 8 hours as needed for pain If you develop a fever > 100.5    2. Pregnancy of unknown anatomic location - Repeat HCG on Saturday 06/12/2024  3. [redacted] weeks gestation of pregnancy   - Discharge home - Keep scheduled appt in MAU at 0830 on Saturday - Patient verbalized an understanding of the plan of care and agrees.    Ala Cart, CNM 06/09/2024, 9:01 PM

## 2024-06-09 NOTE — Discharge Instructions (Signed)
 Return to MAU: If you have heavier bleeding that soaks through more that 2 pads per hour for an hour or more If you bleed so much that you feel like you might pass out or you do pass out If you have significant abdominal pain that is not improved with Tylenol 1000 mg every 8 hours as needed for pain If you develop a fever > 100.5

## 2024-06-09 NOTE — MAU Note (Signed)
 MAU Triage Note:  .Laurie Farmer is a 23 y.o. at [redacted]w[redacted]d here in MAU reporting: called OB around 1500 for light bleeding that has now become heavier. Patient showed RN a picture of bleeding in triage that appears to be light bleeding. She is also reporting lower abdominal cramping. Denies recent IC or abnormal discharge. Has first US  scheduled for tomorrow.  Patient complaint: Bleeding  Pain Score: 5  Pain Location: Abdomen     Onset of complaint: today LMP: Patient's last menstrual period was 04/20/2024.  Vitals:   06/09/24 1913  BP: 135/76  Pulse: 86  Resp: 16  Temp: 98.6 F (37 C)  SpO2: 100%     Lab orders placed from triage: orders in prior to triage

## 2024-06-10 ENCOUNTER — Other Ambulatory Visit

## 2024-06-10 LAB — GC/CHLAMYDIA PROBE AMP (~~LOC~~) NOT AT ARMC
Chlamydia: NEGATIVE
Comment: NEGATIVE
Comment: NORMAL
Neisseria Gonorrhea: NEGATIVE

## 2024-06-12 ENCOUNTER — Inpatient Hospital Stay (HOSPITAL_COMMUNITY)
Admission: AD | Admit: 2024-06-12 | Discharge: 2024-06-12 | Disposition: A | Discharge status: Home / Self Care | Attending: Obstetrics and Gynecology | Admitting: Obstetrics and Gynecology

## 2024-06-12 ENCOUNTER — Inpatient Hospital Stay (HOSPITAL_COMMUNITY)
Admit: 2024-06-12 | Discharge: 2024-06-12 | Disposition: A | Discharge status: Home / Self Care | Attending: Obstetrics and Gynecology | Admitting: Obstetrics and Gynecology

## 2024-06-12 ENCOUNTER — Other Ambulatory Visit: Payer: Self-pay

## 2024-06-12 ENCOUNTER — Telehealth: Payer: Self-pay | Admitting: Obstetrics and Gynecology

## 2024-06-12 ENCOUNTER — Encounter (HOSPITAL_COMMUNITY): Payer: Self-pay | Admitting: Obstetrics and Gynecology

## 2024-06-12 ENCOUNTER — Encounter: Payer: Self-pay | Admitting: Obstetrics and Gynecology

## 2024-06-12 DIAGNOSIS — O039 Complete or unspecified spontaneous abortion without complication: Secondary | ICD-10-CM

## 2024-06-12 DIAGNOSIS — Z3A01 Less than 8 weeks gestation of pregnancy: Secondary | ICD-10-CM | POA: Diagnosis not present

## 2024-06-12 LAB — HCG, QUANTITATIVE, PREGNANCY: hCG, Beta Chain, Quant, S: 207 m[IU]/mL — ABNORMAL HIGH (ref <5)

## 2024-06-12 NOTE — Telephone Encounter (Signed)
 TC to notify patient of HCG results dropping by more than 50% being dx for SAB. Informed that she would need to have HCG repeated in 1 week. Patient requested for appt be scheduled for Monday morning.  Appt scheduled for 06/21/2024 @ 0815. Patient notified by MyChart message.  Miriah Maruyama, CNM

## 2024-06-12 NOTE — MAU Note (Signed)
.  Laurie Farmer is a 23 y.o. at [redacted]w[redacted]d here in MAU reporting: that she is unsure why she is here today. She reports she was told to come back to MAU.   RN explained that she would have HCG drawn.   Patient denies any new complaints    Pain score: when sitting lower abdominal pain 6/10 Vitals:   06/12/24 0755  BP: 128/72  Pulse: 89  Resp: 12  SpO2: 100%      Lab orders placed from triage:   HCG

## 2024-06-12 NOTE — MAU Provider Note (Signed)
 History   Chief Complaint:  Follow-up   Laurie Farmer is  23 y.o. G2P1001 Patient's last menstrual period was 04/20/2024.Laurie Farmer Patient is here for follow up of quantitative HCG and ongoing surveillance of pregnancy status. She is [redacted]w[redacted]d weeks gestation  by LMP.    Since her last visit, the patient is without new complaint. The patient reports bleeding as  none now.  She report lower abdominal pain of 6/10 with sitting.  General ROS:  positive for lower abdominal pain  Her previous Quantitative HCG values are:  Recent Labs  Lab 06/09/24 1936  HCGBETAQNT 1,560*    Physical Exam   Blood pressure 128/72, pulse 89, resp. rate 12, last menstrual period 04/20/2024, SpO2 100%, currently breastfeeding.  Focused Gynecological Exam: examination not indicated  Labs: Results for orders placed or performed during the hospital encounter of 06/12/24 (from the past 24 hours)  hCG, quantitative, pregnancy   Collection Time: 06/12/24  8:05 AM  Result Value Ref Range   hCG, Beta Chain, Quant, S 207 (H) <5 mIU/mL    Ultrasound Studies:   US  OB LESS THAN 14 WEEKS WITH OB TRANSVAGINAL Result Date: 06/09/2024 CLINICAL DATA:  Moderate bleeding today. EXAM: OBSTETRIC <14 WK US  AND TRANSVAGINAL OB US  TECHNIQUE: Both transabdominal and transvaginal ultrasound examinations were performed for complete evaluation of the gestation as well as the maternal uterus, adnexal regions, and pelvic cul-de-sac. Transvaginal technique was performed to assess early pregnancy. COMPARISON:  No recent studies are available for review. FINDINGS: Intrauterine gestational sac: None Yolk sac:  No Embryo:  No Cardiac Activity: No Heart Rate: None Subchorionic hemorrhage:  None visualized. Maternal uterus/adnexae: The ovaries are within normal limits. A small amount of free fluid is present in the pelvis. IMPRESSION: 1. No evidence of intrauterine pregnancy, which may be due to early pregnancy versus complete miscarriage. Correlation with  beta HCG is recommended and follow-up ultrasound may be obtained if clinically warranted. 2. Small amount of free fluid in the pelvis which may be physiologic. Electronically Signed   By: Leita Birmingham M.D.   On: 06/09/2024 20:53    Assessment:   1. Miscarriage   2. [redacted] weeks gestation of pregnancy      Plan: -Discharge home in stable condition -Miscarriage precautions discussed -Patient advised to follow-up with [facility] -Patient may return to MAU as needed or if her condition were to change or worsen  Ala Cart, CNM 06/12/2024, 8:17 AM

## 2024-06-14 ENCOUNTER — Other Ambulatory Visit

## 2024-06-21 ENCOUNTER — Ambulatory Visit: Payer: Self-pay | Admitting: Obstetrics and Gynecology

## 2024-06-21 ENCOUNTER — Other Ambulatory Visit

## 2024-06-21 DIAGNOSIS — O039 Complete or unspecified spontaneous abortion without complication: Secondary | ICD-10-CM

## 2024-06-21 LAB — BETA HCG QUANT (REF LAB): hCG Quant: 5 m[IU]/mL

## 2024-06-21 NOTE — Progress Notes (Signed)
 Patient here to pick up lab requisition.  Sent to lab.  Laurie Farmer

## 2024-07-14 ENCOUNTER — Encounter: Admitting: Family Medicine

## 2024-09-30 ENCOUNTER — Other Ambulatory Visit (HOSPITAL_COMMUNITY)
Admission: RE | Admit: 2024-09-30 | Discharge: 2024-09-30 | Disposition: A | Discharge status: Home / Self Care | Source: Ambulatory Visit | Attending: Family Medicine | Admitting: Family Medicine

## 2024-09-30 ENCOUNTER — Ambulatory Visit

## 2024-09-30 VITALS — BP 113/66 | HR 93 | Ht 65.0 in | Wt 182.1 lb

## 2024-09-30 DIAGNOSIS — Z202 Contact with and (suspected) exposure to infections with a predominantly sexual mode of transmission: Secondary | ICD-10-CM | POA: Diagnosis present

## 2024-09-30 DIAGNOSIS — N390 Urinary tract infection, site not specified: Secondary | ICD-10-CM

## 2024-09-30 LAB — POCT URINALYSIS DIPSTICK
Bilirubin, UA: NEGATIVE
Blood, UA: NEGATIVE
Glucose, UA: NEGATIVE
Ketones, UA: NEGATIVE
Nitrite, UA: POSITIVE
Protein, UA: POSITIVE — AB
Spec Grav, UA: 1.025 (ref 1.010–1.025)
Urobilinogen, UA: 0.2 U/dL
pH, UA: 7 (ref 5.0–8.0)

## 2024-09-30 NOTE — Progress Notes (Signed)
 Miscarriage 7/10 or 7/11 SUBJECTIVE:  23 y.o. female who desires a STI screen. Denies abnormal vaginal discharge, bleeding or significant pelvic pain. Pt has UTI symptoms. Denies history of known exposure to STD.  Patient's last menstrual period was 04/20/2024.  OBJECTIVE:  She appears well.   ASSESSMENT:  STI Screen   PLAN:  Pt offered STI blood screening-declined GC, chlamydia, and trichomonas probe sent to lab.  Treatment: To be determined once lab results are received.  Pt follow up as needed.   SUBJECTIVE: Montine Hight is a 23 y.o. female who complains of urinary frequency, urgency and dysuria x 5 days, without flank pain, fever, chills, or abnormal vaginal discharge or bleeding.   OBJECTIVE: Appears well, in no apparent distress.  Vital signs are normal. Urine dipstick shows positive for leukocytes.    ASSESSMENT: Dysuria  PLAN: Treatment per orders.  Call or return to clinic prn if these symptoms worsen or fail to improve as anticipated.   Shawnee Fleet, CMA

## 2024-10-04 LAB — URINE CULTURE

## 2024-10-04 LAB — CERVICOVAGINAL ANCILLARY ONLY
Chlamydia: NEGATIVE
Comment: NEGATIVE
Comment: NORMAL
Neisseria Gonorrhea: NEGATIVE

## 2024-10-05 ENCOUNTER — Ambulatory Visit: Payer: Self-pay | Admitting: Family Medicine

## 2024-10-05 MED ORDER — FLUCONAZOLE 150 MG PO TABS: 150.0000 mg | ORAL_TABLET | Freq: Once | ORAL | 0 refills | Status: AC

## 2024-10-05 MED ORDER — CEFADROXIL 500 MG PO CAPS: 500.0000 mg | ORAL_CAPSULE | Freq: Two times a day (BID) | ORAL | 0 refills | Status: DC

## 2024-10-11 ENCOUNTER — Other Ambulatory Visit: Payer: Self-pay

## 2024-10-11 DIAGNOSIS — N390 Urinary tract infection, site not specified: Secondary | ICD-10-CM

## 2024-10-11 MED ORDER — CEFADROXIL 500 MG PO CAPS: 500.0000 mg | ORAL_CAPSULE | Freq: Two times a day (BID) | ORAL | 0 refills | Status: AC

## 2024-10-11 NOTE — Progress Notes (Signed)
 Resent Rx to corrected pharmacy.  Erminio DELENA Rumps, RN
